# Patient Record
Sex: Female | Born: 1970 | Hispanic: Yes | Marital: Single | Smoking: Never smoker
Health system: Southern US, Community
[De-identification: ages and names within clinical notes are randomized; demographics above are authoritative.]

## PROBLEM LIST (undated history)

## (undated) HISTORY — PX: GALLBLADDER SURGERY: SHX652

---

## 2014-08-20 ENCOUNTER — Ambulatory Visit (INDEPENDENT_AMBULATORY_CARE_PROVIDER_SITE_OTHER): Payer: Self-pay | Admitting: Family Medicine

## 2014-08-20 VITALS — BP 136/82 | HR 78 | Temp 98.1°F | Resp 18 | Ht 58.5 in | Wt 154.8 lb

## 2014-08-20 DIAGNOSIS — H65192 Other acute nonsuppurative otitis media, left ear: Secondary | ICD-10-CM

## 2014-08-20 MED ORDER — HYDROCODONE-ACETAMINOPHEN 5-325 MG PO TABS
1.0000 | ORAL_TABLET | Freq: Four times a day (QID) | ORAL | Status: DC | PRN
Start: 2014-08-20 — End: 2014-12-14

## 2014-08-20 MED ORDER — AMOXICILLIN 875 MG PO TABS: 875.0000 mg | ORAL_TABLET | Freq: Two times a day (BID) | ORAL | Status: DC

## 2014-08-20 NOTE — Progress Notes (Signed)
° °  Subjective:    Patient ID: Christine Brown, female    DOB: 08/17/1970, 44 y.o.   MRN: [id number] This chart was scribed for Robyn Haber, MD by Marti Sleigh, Medical Scribe. This patient was seen in Room 11 and the patient's care was started a 12:13 PM.  Chief Complaint  Patient presents with   Fever    x 1 week   Generalized Body Aches    back pain   Chills   Cough    productive cough, green phlegm   Headache    eye pain, dizziness, weak   Ear Pain    left ear was bleeding yesterday    HPI HPI Comments: Christine Brown is a 44 y.o. female who presents to [facility] complaining of cough productive of green sputum as well as fever and chills with associated generalized body aches and back pain that started 8 days ago. Pt states in the last three days she has developed a severe HA, weakness, photophobia, phonophobia, intermittent dizziness and ear pain with associated bloody discharge. Pt has [place]. Pt states she has been taking pain medication for her HA and ear pain every day for the last week.   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   Review of Systems  Constitutional: Positive for fever and chills.  HENT: Positive for congestion, ear pain, sinus pressure and sore throat.   Respiratory: Positive for cough.   Musculoskeletal: Positive for myalgias.  Neurological: Positive for headaches.       Objective:   Physical Exam  Constitutional: She is oriented to person, place, and time. She appears well-developed and well-nourished.  HENT:  Head: Normocephalic and atraumatic.  Eyes: Pupils are equal, round, and reactive to light.  Neck: Neck supple.  Cardiovascular: Normal rate and regular rhythm.   Pulmonary/Chest: Effort normal and breath sounds normal. No respiratory distress.  Neurological: She is alert and oriented to person, place, and time.  Skin: Skin is warm and dry.  Psychiatric: She has a normal mood and affect. Her behavior is normal.  Nursing note and vitals  reviewed.      Assessment & Plan:   This chart was scribed in my presence and reviewed by me personally.    ICD-9-CM ICD-10-CM   1. Acute nonsuppurative otitis media of left ear 381.00 H65.192 amoxicillin (AMOXIL) 875 MG tablet     HYDROcodone-acetaminophen (NORCO) 5-325 MG per tablet     Signed, Robyn Haber, MD

## 2014-08-20 NOTE — Patient Instructions (Signed)
Otitis media exudativa ( Otitis Media With Effusion) La otitis media exudativa es la presencia de lquido en el odo medio. Es un problema comn en los nios y generalmente, tiene como consecuencia una infeccin en el odo. Puede estar latente durante semanas o ms, luego de la infeccin. A diferencia de una otitis aguda, la otitis media exudativa hace referencia nicamente al lquido que se encuentra detrs del tmpano y no a la infeccin. Los nios que padecen constantemente otitis, sinusitis y problemas de [place] son los ms propensos a tener otitis media exudativa. CAUSAS  La causa ms frecuente de la acumulacin de lquido es la disfuncin de las trompas de [place]. Estos conductos son los que drenan el lquido de los odos hasta la parte posterior de la nariz (nasofaringe). SNTOMAS   El sntoma principal de esta afeccin es la prdida de la audicin. Como consecuencia, es posible que usted o el nio hagan lo siguiente:  Pharmacist, community la televisin a Merchant navy officer.  No responder a las preguntas.  Preguntar "qu?" con frecuencia cuando se les habla.  Equivocarse o confundir una palabra o un sonido por otro.  Probablemente sienta presin en el odo o lo sienta tapado, pero sin dolor. DIAGNSTICO   El mdico diagnosticar esta afeccin luego de examinar sus odos o los del [place].  Es posible que el mdico controle la presin en sus odos o en los del nio con un timpanmetro.  Probablemente se le realice una prueba de audicin si el problema persiste. [place] y los efectos del exudado.  Es posible que los antibiticos, los descongestivos, las gotas nasales y los medicamentos del tipo de la cortisona (en comprimidos o aerosol nasal) no sean de [place].  Los nios con exudado persistente en los odos posiblemente tengan problemas en el desarrollo del lenguaje o problemas de conducta. Es probable que los nios que corren riesgo de sufrir  retrasos en el desarrollo de la audicin, [place] aprendizaje y el habla necesiten ser derivados a un especialista antes que los nios que no corren Copy.  Su mdico o el de su hijo puede sugerirle una derivacin a un otorrinolaringlogo para recibir Lexicographer. Lo siguiente puede ayudar a restaurar la audicin normal:  Drenaje del lquido.  Colocacin de tubos en el odo (tubos de timpanostoma).  Remocin de las adenoides (adenoidectoma). INSTRUCCIONES PARA EL CUIDADO EN EL HOGAR   Evite ser un fumador pasivo.  Los bebs que son amamantados son menos propensos a Social worker.  Evite amamantar al beb mientras est acostada.  Evite los alrgenos ambientales conocidos.  Evite el contacto con personas enfermas. SOLICITE ATENCIN MDICA SI:   La audicin no mejora en 23meses.  La audicin empeora.  Siente dolor de odos.  Tiene una secrecin que sale del odo.  Tiene mareos. ASEGRESE DE QUE:   Comprende estas instrucciones.  Controlar su afeccin.  Recibir ayuda de inmediato si no mejora o si empeora. Document Released: 07/22/2005 Document Revised: 05/12/2013 [facility] Patient Information 2015 [place], [place]. This information is not intended to replace advice given to you by your health care provider. Make sure you discuss any questions you have with your health care provider.

## 2014-08-22 ENCOUNTER — Telehealth: Payer: Self-pay

## 2014-08-22 NOTE — Telephone Encounter (Signed)
Patient called to check the status of her work note. I informed patient it was still in the process and our office would call when ready to be picked up. Patient is requesting to pick it up today because she has to turn it in to her employer tomorrow morning. Patients call back number is [phone]

## 2014-08-22 NOTE — Telephone Encounter (Signed)
Letter written- in pick up drawer Pt notified.

## 2014-08-22 NOTE — Telephone Encounter (Signed)
Patient was seen Saturday by Dr. Carlean Jews and was given an excuse note for Saturday 08/20/14 and Sunday 08/21/14  to be off. He told her if she is still not feeling well with her infection to call our office and we can rewrite another note for her to be excused on Monday 08/22/14. Please call when done with excuse note for her she will come by to pick up today. She would also like it all in one note to say she was to be excused from Saturday 08/20/14- Monday 08/22/14. Thank you  Best: [phone]

## 2014-10-21 ENCOUNTER — Ambulatory Visit: Payer: Self-pay | Admitting: Internal Medicine

## 2014-12-14 ENCOUNTER — Emergency Department (HOSPITAL_COMMUNITY)
Admission: EM | Admit: 2014-12-14 | Discharge: 2014-12-14 | Disposition: A | Discharge status: Home / Self Care | Payer: Worker's Compensation | Attending: Emergency Medicine | Admitting: Emergency Medicine

## 2014-12-14 ENCOUNTER — Encounter (HOSPITAL_COMMUNITY): Payer: Self-pay | Admitting: Emergency Medicine

## 2014-12-14 ENCOUNTER — Emergency Department (HOSPITAL_COMMUNITY): Payer: Worker's Compensation

## 2014-12-14 DIAGNOSIS — S161XXA Strain of muscle, fascia and tendon at neck level, initial encounter: Secondary | ICD-10-CM | POA: Insufficient documentation

## 2014-12-14 DIAGNOSIS — S76219A Strain of adductor muscle, fascia and tendon of unspecified thigh, initial encounter: Secondary | ICD-10-CM

## 2014-12-14 DIAGNOSIS — Z3202 Encounter for pregnancy test, result negative: Secondary | ICD-10-CM | POA: Insufficient documentation

## 2014-12-14 DIAGNOSIS — W19XXXA Unspecified fall, initial encounter: Secondary | ICD-10-CM

## 2014-12-14 DIAGNOSIS — S4992XA Unspecified injury of left shoulder and upper arm, initial encounter: Secondary | ICD-10-CM | POA: Diagnosis not present

## 2014-12-14 DIAGNOSIS — W010XXA Fall on same level from slipping, tripping and stumbling without subsequent striking against object, initial encounter: Secondary | ICD-10-CM | POA: Diagnosis not present

## 2014-12-14 DIAGNOSIS — S79921A Unspecified injury of right thigh, initial encounter: Secondary | ICD-10-CM | POA: Insufficient documentation

## 2014-12-14 DIAGNOSIS — S39012A Strain of muscle, fascia and tendon of lower back, initial encounter: Secondary | ICD-10-CM | POA: Insufficient documentation

## 2014-12-14 DIAGNOSIS — S39011A Strain of muscle, fascia and tendon of abdomen, initial encounter: Secondary | ICD-10-CM | POA: Diagnosis not present

## 2014-12-14 DIAGNOSIS — S199XXA Unspecified injury of neck, initial encounter: Secondary | ICD-10-CM | POA: Diagnosis present

## 2014-12-14 DIAGNOSIS — Z792 Long term (current) use of antibiotics: Secondary | ICD-10-CM | POA: Insufficient documentation

## 2014-12-14 DIAGNOSIS — Y9289 Other specified places as the place of occurrence of the external cause: Secondary | ICD-10-CM | POA: Insufficient documentation

## 2014-12-14 DIAGNOSIS — Y998 Other external cause status: Secondary | ICD-10-CM | POA: Diagnosis not present

## 2014-12-14 DIAGNOSIS — S79922A Unspecified injury of left thigh, initial encounter: Secondary | ICD-10-CM | POA: Diagnosis not present

## 2014-12-14 DIAGNOSIS — Y9389 Activity, other specified: Secondary | ICD-10-CM | POA: Insufficient documentation

## 2014-12-14 LAB — POC URINE PREG, ED: Preg Test, Ur: NEGATIVE

## 2014-12-14 MED ORDER — HYDROCODONE-ACETAMINOPHEN 5-325 MG PO TABS: 1.0000 | ORAL_TABLET | Freq: Four times a day (QID) | ORAL | Status: DC | PRN

## 2014-12-14 MED ORDER — CYCLOBENZAPRINE HCL 10 MG PO TABS: 10.0000 mg | ORAL_TABLET | Freq: Two times a day (BID) | ORAL | Status: DC | PRN

## 2014-12-14 NOTE — ED Notes (Signed)
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  Pt c/o right groin pain, neck and back pain.  Pt can move all her extremities.  Pt denies hitting her head.

## 2014-12-14 NOTE — ED Notes (Signed)
Bed: LM76 Expected date:  Expected time:  Means of arrival:  Comments: Fall back pain

## 2014-12-14 NOTE — Discharge Instructions (Signed)
Distensin lumbosacra (Lumbosacral Strain) La distensin lumbosacra es una distensin de cualquiera de las partes que componen las vrtebras lumbosacras. Las vrtebras lumbosacras son los huesos que conforman el tercio inferior de la columna vertebral. Estas vrtebras estn sostenidas por msculos y un resistente tejido fibroso (ligamentos).  CAUSAS  Un golpe repentino en la espalda puede provocar una distensin lumbosacra. Adems, cualquier tipo de movimiento que cause una elongacin excesiva de los msculos de la zona lumbar puede provocar este tipo de distensin. Esto se ve normalmente en las personas que se esfuerzan demasiado, se caen, levantan objetos pesados, se agachan o estn en cuclillas con regularidad. [place] agotador.  Participar en deportes en los cuales se deba empujar o tirar y que requieren de un giro repentino de la espalda (tenis, golf, bisbol).  Levantar peso.  Curvatura excesiva de la zona lumbar.  Pelvis hacia adelante.  Espalda o msculos abdominales dbiles, o ambos.  Tendones isquiotibiales tensos. SIGNOS Y SNTOMAS  La distensin lumbosacra puede provocar dolor en la zona de la lesin o un dolor que baja (se extiende) hasta la pierna.  DIAGNSTICO Con frecuencia, el mdico puede diagnosticar una distensin [employer] Corporation un examen fsico. En algunos casos, es posible que deba realizarse pruebas, como una [place].  TRATAMIENTO  El tratamiento para la lesin lumbar depende de muchos factores que el mdico Personnel officer. Sin embargo, la [employer] de los tratamientos incluye el uso de antiinflamatorios. INSTRUCCIONES PARA EL CUIDADO EN EL HOGAR   Evite actividades fsicas difciles (tenis, raquetbol, esqu acutico) si no tiene un buen estado fsico para practicarlas. Esto puede Museum/gallery conservator.  Si tiene un problema en la espalda, evite los deportes que requieren de movimientos corporales bruscos. La natacin y las  caminatas son las actividades ms seguras.  Mantenga una buena postura.  Mantenga un peso saludable.  En el caso de episodios agudos, puede colocar hielo en la zona lesionada.  Ponga el hielo en una bolsa plstica.  Coloque una toalla entre la piel y la bolsa de hielo.  Deje el hielo durante 20 minutos, 2 a 3 veces por da.  Cuando la zona lumbar comience a sanar, es posible que le recomienden ejercicios de elongacin y fortalecimiento. SOLICITE ATENCIN MDICA SI:  El dolor de Loss adjuster, chartered.  Tiene un dolor de espalda intenso que no mejora con medicamentos. SOLICITE ATENCIN [place] DE INMEDIATO SI:   Siente entumecimiento, hormigueo, debilidad o problemas con el uso de los brazos o las piernas.  Nota cambios en el control de la vejiga o el intestino.  Siente un aumento del Chief Operating Officer parte del cuerpo, incluido el vientre (abdomen).  Nota que le falta el aire, se siente mareado o se desmaya.  Tiene Higher education careers adviser (nuseas), vomita o comienza a sudar.  Nota un cambio de color en los dedos del pie o las piernas, o los pies se ponen muy fros. ASEGRESE DE QUE:   Comprende estas instrucciones.  Controlar su afeccin.  Recibir ayuda de inmediato si no mejora o si empeora. Document Released: 05/01/2005 Document Revised: 07/27/2013 [facility] Patient Information 2015 [place]. This information is not intended to replace advice given to you by your health care provider. Make sure you discuss any questions you have with your health care provider. Distensin inguinal  (Groin Strain) Una distensin en la ingle (tambin llamado tirn en la ingle) es una lesin en los msculos o los tendones de la parte interna superior del muslo. Estos msculos se llaman aductores  o msculos de la ingle. Son los que permiten mover la pierna cruzando el cuerpo. La distensin muscular se produce cuando un msculo se estira demasiado y algunas fibras musculares se rompen. Una  distensin en la ingle puede variar de leve a grave segn la cantidad de fibras musculares afectadas y si las fibras musculares se han desgarrado parcial o totalmente.  La distensin de la ingle generalmente ocurre durante la prctica de ejercicios o deportes. La lesin ocurre cuando se ejerce una fuerza violenta y sbita en un msculo, y el msculo se extiende demasiado. Tambin ocurre cuando no se hace precalentameinto de los msculos o si no estn debidamente acondicionados. Segn la gravedad de la lesin, el tiempo de recuperacin puede variar desde unas pocas semanas a varias semanas. Las lesiones graves requieren 4 a 6 semanas para la recuperacin. En estos casos, la curacin completa puede demorar 4 a 5 meses.  CAUSAS   Gran estiramiento de los msculos de la ingle o estirarlos muy rpidamente generalmente durante el movimiento de lado a lado, con un cambio brusco de direccin.  Ejercer un esfuerzo repetido Micron Technology de la ingle durante un largo perodo de [place].  Realizar actividades vigorosas sin Electrical engineer los msculos de la ingle. SNTOMAS   Dolor y sensibilidad en el rea inguinal. Comienza como un dolor agudo y persiste como un dolor sordo.  Sensacin de crujido o golpeteo cuando se produce la lesin (por graves distensiones).  Hinchazn o moretones.  Espasmos musculares.  Debilidad en las piernas.  Rigidez en la zona de la ingle, con disminucin de la capacidad de mover los msculos afectados. DIAGNSTICO  El Viacom har un examen fsico para diagnosticar la lesin en la ingle. Le preguntar acerca de sus sntomas y sobre cmo ocurri la lesin. En algunos casos ser necesario descartar una fractura sea o problemas en el cartlago. El mdico le puede indicar una tomografa computada o una resonancia magntica si sospecha un desgarro muscular completo.  TRATAMIENTO  La lesin en la ingle curar por s sola. El mdico puede recetarle medicamentos para  Glass blower/designer y la hinchazn (antiinflamatorios). Es posible que le indiquen que utilice muletas durante los primeros das para Air traffic controller.  INSTRUCCIONES PARA EL CUIDADO EN EL HOGAR   Haga reposo. No use el msculo lesionado si siente dolor.  Aplique hielo sobre la zona lesionada.  Ponga el hielo en una bolsa plstica.  Colquese una toalla entre la piel y la bolsa de hielo.  Deje el hielo en el lugar durante 15 a 20 minutos cada 2  3 horas. Hgalo durante los Dana Corporation despus de la lesin.  Tome slo medicamentos de venta libre o recetados, segn las indicaciones del mdico.  Vende la zona lesionada con una venda elstica segn las indicaciones del mdico.  Mantenga la pierna lesionada levantada elevada.  Camine, elongue y realice ejercicios de amplitud de movimientos para mejorar el flujo de sangre en la zona lesionada. Realice slo estas actividades si puede hacerlas sin dolor. Para evitar distensiones musculares:   Realice precalentamiento antes de la actividad fsica.  Fortifique y [employer] de la ingle. SOLICITE ATENCIN MDICA DE INMEDIATO SI:   Siente un dolor cada vez ms intenso o hinchazn en la zona afecada.   Los sntomas no mejoran o empeoran. ASEGRESE DE QUE:   Comprende estas instrucciones.  Controlar su enfermedad.  Solicitar ayuda de inmediato si no mejora o si empeora. Document Released: 10/29/2007 Document Revised: 07/08/2012  ExitCare Patient Information 2015 [place]. This information is not intended to replace advice given to you by your health care provider. Make sure you discuss any questions you have with your health care provider. Distensin cervical (Cervical Sprain) Una distensin cervical es una lesin en el cuello, en la que los tejidos fuertes y fibrosos (ligamentos) que unen los huesos del cuello, se distienden o se rompen. Una distensin cervical puede ser desde muy leve a muy  grave. En los casos graves pueden hacer que las vrtebras del cuello se vuelvan inestables. Esto puede causar un dao en la mdula espinal y puede dar Environmental consultant a graves problemas del [place]. La cantidad de tiempo que demora la mejora de una distensin cervical depende de la causa y de la extensin de la lesin. [place] veces se cura en 1 a 3 semanas. CAUSAS  Las distensiones graves pueden ser causadas por:   Lesiones por deportes de contacto (como en el ftbol Scientist, forensic, rugby, [place], hockey, automovilismo, gimnasia, buceo, artes Reynolds American y boxeo).  Colisiones en vehculos de motor.  Lesiones de Buyer, retail cervical. Esta es una lesin por movimiento brusco de adelante hacia atrs de la cabeza y el cuello.  Cadas. [place] distensiones cervicales leves pueden ser:   Adoptar posiciones incmodas, como sostener el telfono entre la oreja y [place].  Sentarse en una silla que no ofrece el soporte adecuado.  Trabajar en una mesa de computadora mal diseada.  Las Deere & Company que requieren mirar hacia arriba o hacia abajo durante largos perodos. SNTOMAS   Dolor, sensibilidad, rigidez, o sensacin de ardor en la parte anterior, posterior o lateral del cuello. Este malestar puede aparecer inmediatamente despus de la lesin o puede desarrollarse lentamente y no empezar hasta 24 horas o ms despus de la lesin.  Dolor o sensibilidad que se siente directamente en la parte media posterior del cuello.  Dolor en el hombro o la zona superior de la espalda.  Capacidad limitada para mover el cuello.  Dolor de [place].  Mareos.  Debilidad, entumecimiento u hormigueo en las manos o los brazos.  Espasmos musculares.  Dificultades para tragar o comer.  Sensibilidad e hinchazn en el cuello. [place] veces, el mdico puede diagnosticar este problema mediante la historia clnica y un examen fsico. Su mdico le preguntar acerca de lesiones  previas y problemas conocidos como artritis en el cuello. Podrn tomarle radiografas para determinar si hay otros problemas, como enfermedades en los huesos del cuello. Tambin puede ser Allstate realizar otras pruebas, como tomografas computadas o Health visitor.  TRATAMIENTO  El tratamiento depende de la gravedad de la distensin. Las distensiones leves se pueden tratar con reposo, manteniendo el cuello en su lugar (inmovilizacin) y usando medicamentos para Conservation officer, historic buildings. Las distensiones graves deben ser inmediatamente inmovilizadas. Ser necesario completar el tratamiento para Best boy, los espasmos musculares y otros sntomas, y puede incluir.  Medicamentos como calmantes para el dolor, anestsicos o relajantes musculares.  Fisioterapia. Esto puede incluir ejercicios de elongacin, fortalecimiento y Chiropodist de Advertising copywriter. Los ejercicios y [place] mejor postura pueden ayudar a estabilizar el cuello, fortalecer los msculos y evitar que los sntomas vuelvan a Arts administrator. INSTRUCCIONES PARA EL CUIDADO EN EL HOGAR   Aplique hielo sobre la zona lesionada.  Ponga el hielo en una bolsa plstica.  Colquese una toalla entre la piel y la bolsa de hielo.  Deje el hielo durante 15 - 20 minutos y aplquelo 3 - 4  veces por da.  Si la lesin fue grave, le indicarn el uso de un collarn cervical. El collarn cervical es un collar de dos piezas para impedir que el cuello se mueva [place] se [place].  Nose quite el collarn excepto que se lo indique su mdico.  Si tiene el cabello largo, mantngalo fuera del collarn.  Consulte a su mdico antes de hacerle ajustes. Los [employer] pueden ser requeridos con el tiempo para Garment/textile technologist confort y reducir la presin sobre la barbilla o en la parte posterior de la cabeza.  Si le permiten quitarse el collarn para lavarlo o darse un bao, siga las indicaciones de su mdico acerca de cmo hacerlo con seguridad.  Mantenga el collarn limpio  pasando un pao con agua y [place] y secndolo bien. Si el collarn tiene almohadillas removibles, qutelas cada 1-2 das para lavarlas a mano con agua y [place]. Deje que se sequen al aire. Debe secarlas bien antes de volver a colocarlas en el collarn.  Si le permiten quitarse el collarn para lavarlo y darse un bao, lave y seque la piel del cuello. Controle su piel para detectar irritacin o llagas. Si las tiene, informe a su mdico.  No conduzca vehculos mientras [place] el collarn.  Slo tome medicamentos de venta libre o recetados para Glass blower/designer, el malestar o bajar la fiebre, segn las indicaciones de su mdico.  Cumpla con todas las visitas de control, segn le indique su mdico.  Cumpla con todas las sesiones de fisioterapia, segn le indique su mdico.  Haga los ajustes necesarios en su lugar de [place] para favorecer una buena postura.  Evite las posiciones y actividades que [employer] sntomas.  Haga precalentamiento y elongue antes de comenzar una actividad para Customer service manager. SOLICITE ATENCIN MDICA SI:   El dolor no se alivia con los [employer].  No puede disminuir la dosis de analgsicos segn lo planificado.  Su nivel de actividad no mejora segn lo esperado. SOLICITE ATENCIN MDICA DE INMEDIATO SI:   Presenta cualquier hemorragia.  Siente Higher education careers adviser.  Tiene signos de reaccin alrgica a los medicamentos.  Los sntomas empeoran.  Le aparecen sntomas nuevos e inexplicables.  Siente adormecimiento, hormigueo, debilidad o parlisis en alguna parte del cuerpo. ASEGRESE DE QUE:   Comprende estas instrucciones.  Controlar su afeccin.  Recibir ayuda de inmediato si no mejora o si empeora. Document Released: 10/18/2008 Document Revised: 05/12/2013 [facility] Patient Information 2015 [place]. This information is not intended to replace advice given to you by your health care provider. Make sure you discuss any questions you have with  your health care provider.

## 2014-12-14 NOTE — ED Provider Notes (Signed)
CSN: [id number]     Arrival date & time 12/14/14  1015 History   First MD Initiated Contact with Patient 12/14/14 1022     Chief Complaint  Patient presents with  . Fall  . Groin Pain  . Neck Pain  . Back Pain     (Consider location/radiation/quality/duration/timing/severity/associated sxs/prior Treatment) HPI Comments: Patient with no pertinent past medical history presents to the emergency department with chief complaint of mechanical fall. Patient states that she slipped on a Pool standing water. States that this caused her to "do the splits."  Reports feeling a pop in her groin. She complains of groin pain. Pain is worsened with movement and palpation. There is no bony abnormality or deformity. She also complains of low back pain and left sided neck pain. She denies hitting her head. Denies any LOC. She has not taken anything for her symptoms. She denies any other symptoms at this time.  The history is provided by the patient. No language interpreter was used.    History reviewed. No pertinent past medical history. Past Surgical History  Procedure Laterality Date  . Gallbladder surgery     Family History  Problem Relation Age of Onset  . Heart disease Mother   . Heart disease Father   . Heart disease Brother    History  Substance Use Topics  . Smoking status: Never Smoker   . Smokeless tobacco: Never Used  . Alcohol Use: No   OB History    No data available     Review of Systems  Constitutional: Negative for fever and chills.  Respiratory: Negative for shortness of breath.   Cardiovascular: Negative for chest pain.  Gastrointestinal: Negative for nausea, vomiting, diarrhea and constipation.  Genitourinary: Negative for dysuria.  Musculoskeletal: Positive for myalgias, back pain and arthralgias.      Allergies  Review of patient's allergies indicates no known allergies.  Home Medications   Prior to Admission medications   Medication Sig Start Date End Date  Taking? Authorizing Provider  amoxicillin (AMOXIL) 875 MG tablet Take 1 tablet (875 mg total) by mouth 2 (two) times daily. 08/20/14   Robyn Haber, MD  HYDROcodone-acetaminophen (NORCO) 5-325 MG per tablet Take 1 tablet by mouth every 6 (six) hours as needed for moderate pain. 08/20/14   Robyn Haber, MD   BP 132/69 mmHg  Pulse 88  Temp(Src) 98.9 F (37.2 C) (Oral)  Resp 17  SpO2 98%  LMP 12/05/2014 Physical Exam  Constitutional: She is oriented to person, place, and time. She appears well-developed and well-nourished.  HENT:  Head: Normocephalic and atraumatic.  Eyes: Conjunctivae and EOM are normal. Pupils are equal, round, and reactive to light.  Neck: Normal range of motion. Neck supple.  Cardiovascular: Normal rate and regular rhythm.  Exam reveals no gallop and no friction rub.   No murmur heard. Pulmonary/Chest: Effort normal and breath sounds normal. No respiratory distress. She has no wheezes. She has no rales. She exhibits no tenderness.  Abdominal: Soft. Bowel sounds are normal. She exhibits no distension and no mass. There is no tenderness. There is no rebound and no guarding.  Musculoskeletal: Normal range of motion. She exhibits no edema or tenderness.  Lumbar paraspinal muscles moderately tender to palpation, left sided cervical paraspinal and left upper trapezius muscles tender to palpation, medial thighs mildly tender to palpation bilaterally, no large hematoma, deformity, or abnormality, doubt muscle tear, able to ambulate  Neurological: She is alert and oriented to person, place, and time.  Skin:  Skin is warm and dry.  Psychiatric: She has a normal mood and affect. Her behavior is normal. Judgment and thought content normal.  Nursing note and vitals reviewed.   ED Course  Procedures (including critical care time) Results for orders placed or performed during the hospital encounter of 12/14/14  POC urine preg, ED (not at [facility])  Result Value Ref Range   Preg  Test, Ur NEGATIVE NEGATIVE   Dg Lumbar Spine Complete  12/14/2014   CLINICAL DATA:  Fall.  Back pain  EXAM: LUMBAR SPINE - COMPLETE 4+ VIEW  COMPARISON:  None.  FINDINGS: Negative for fracture or pars defect.  Normal alignment  Moderate disc degeneration at L5-S1 with disc space narrowing and spurring. Remaining disc spaces have normal height.  IMPRESSION: Disc degeneration and spondylosis L5-S1.  Negative for fracture.   Electronically Signed   By: Franchot Gallo M.D.   On: 12/14/2014 12:48      EKG Interpretation None      MDM   Final diagnoses:  Fall, initial encounter  Cervical strain, initial encounter  Lumbar strain, initial encounter  Groin strain, initial encounter    Patient with mechanical fall. Suspect groin strain secondary to bilateral lower extremity extreme abduction. Will check plain films of low back. C-spine cleared by [facility].    Montine Circle, PA-C 12/14/14 [place], MD 12/15/14 [phone]

## 2014-12-16 ENCOUNTER — Encounter (HOSPITAL_COMMUNITY): Payer: Self-pay | Admitting: Emergency Medicine

## 2014-12-16 ENCOUNTER — Emergency Department (HOSPITAL_COMMUNITY)
Admission: EM | Admit: 2014-12-16 | Discharge: 2014-12-16 | Disposition: A | Discharge status: Home / Self Care | Payer: Worker's Compensation | Attending: Emergency Medicine | Admitting: Emergency Medicine

## 2014-12-16 ENCOUNTER — Emergency Department (HOSPITAL_COMMUNITY): Payer: Worker's Compensation

## 2014-12-16 DIAGNOSIS — K5900 Constipation, unspecified: Secondary | ICD-10-CM | POA: Diagnosis present

## 2014-12-16 DIAGNOSIS — W19XXXA Unspecified fall, initial encounter: Secondary | ICD-10-CM

## 2014-12-16 DIAGNOSIS — W19XXXD Unspecified fall, subsequent encounter: Secondary | ICD-10-CM

## 2014-12-16 DIAGNOSIS — S46912D Strain of unspecified muscle, fascia and tendon at shoulder and upper arm level, left arm, subsequent encounter: Secondary | ICD-10-CM | POA: Diagnosis not present

## 2014-12-16 DIAGNOSIS — S93402D Sprain of unspecified ligament of left ankle, subsequent encounter: Secondary | ICD-10-CM | POA: Diagnosis not present

## 2014-12-16 DIAGNOSIS — W1839XD Other fall on same level, subsequent encounter: Secondary | ICD-10-CM | POA: Diagnosis not present

## 2014-12-16 DIAGNOSIS — S99929A Unspecified injury of unspecified foot, initial encounter: Secondary | ICD-10-CM

## 2014-12-16 DIAGNOSIS — Z79899 Other long term (current) drug therapy: Secondary | ICD-10-CM | POA: Insufficient documentation

## 2014-12-16 DIAGNOSIS — M533 Sacrococcygeal disorders, not elsewhere classified: Secondary | ICD-10-CM | POA: Insufficient documentation

## 2014-12-16 DIAGNOSIS — S46812D Strain of other muscles, fascia and tendons at shoulder and upper arm level, left arm, subsequent encounter: Secondary | ICD-10-CM

## 2014-12-16 LAB — COMPREHENSIVE METABOLIC PANEL
ALT: 31 U/L (ref 14–54)
AST: 31 U/L (ref 15–41)
Albumin: 4.1 g/dL (ref 3.5–5.0)
Alkaline Phosphatase: 56 U/L (ref 38–126)
Anion gap: 7 (ref 5–15)
BILIRUBIN TOTAL: 0.8 mg/dL (ref 0.3–1.2)
BUN: 12 mg/dL (ref 6–20)
CO2: 29 mmol/L (ref 22–32)
Calcium: 8.9 mg/dL (ref 8.9–10.3)
Chloride: 103 mmol/L (ref 101–111)
Creatinine, Ser: 0.5 mg/dL (ref 0.44–1.00)
Glucose, Bld: 100 mg/dL — ABNORMAL HIGH (ref 65–99)
POTASSIUM: 3.9 mmol/L (ref 3.5–5.1)
Sodium: 139 mmol/L (ref 135–145)
Total Protein: 7.7 g/dL (ref 6.5–8.1)

## 2014-12-16 LAB — CBC WITH DIFFERENTIAL/PLATELET
BASOS PCT: 0 % (ref 0–1)
Basophils Absolute: 0 10*3/uL (ref 0.0–0.1)
Eosinophils Absolute: 0.1 10*3/uL (ref 0.0–0.7)
Eosinophils Relative: 2 % (ref 0–5)
HEMATOCRIT: 38.8 % (ref 36.0–46.0)
HEMOGLOBIN: 13.3 g/dL (ref 12.0–15.0)
Lymphocytes Relative: 42 % (ref 12–46)
Lymphs Abs: 2.7 10*3/uL (ref 0.7–4.0)
MCH: 32.4 pg (ref 26.0–34.0)
MCHC: 34.3 g/dL (ref 30.0–36.0)
MCV: 94.6 fL (ref 78.0–100.0)
MONO ABS: 0.4 10*3/uL (ref 0.1–1.0)
Monocytes Relative: 6 % (ref 3–12)
NEUTROS ABS: 3.2 10*3/uL (ref 1.7–7.7)
Neutrophils Relative %: 50 % (ref 43–77)
PLATELETS: 231 10*3/uL (ref 150–400)
RBC: 4.1 MIL/uL (ref 3.87–5.11)
RDW: 12.2 % (ref 11.5–15.5)
WBC: 6.4 10*3/uL (ref 4.0–10.5)

## 2014-12-16 MED ORDER — DIAZEPAM 5 MG PO TABS
5.0000 mg | ORAL_TABLET | Freq: Once | ORAL | Status: AC
  Administered 2014-12-16: 5 mg via ORAL
  Filled 2014-12-16: qty 1

## 2014-12-16 MED ORDER — NAPROXEN 500 MG PO TABS: 500.0000 mg | ORAL_TABLET | Freq: Two times a day (BID) | ORAL | Status: DC

## 2014-12-16 MED ORDER — MAGNESIUM CITRATE PO SOLN
1.0000 | Freq: Once | ORAL | Status: AC
  Administered 2014-12-16: 1 via ORAL
  Filled 2014-12-16: qty 296

## 2014-12-16 MED ORDER — DIAZEPAM 5 MG PO TABS: 5.0000 mg | ORAL_TABLET | Freq: Three times a day (TID) | ORAL | Status: DC | PRN

## 2014-12-16 MED ORDER — DEXAMETHASONE SODIUM PHOSPHATE 10 MG/ML IJ SOLN
10.0000 mg | Freq: Once | INTRAMUSCULAR | Status: AC
  Administered 2014-12-16: 10 mg via INTRAMUSCULAR
  Filled 2014-12-16: qty 1

## 2014-12-16 NOTE — Discharge Instructions (Signed)
Take magnesium citrate when you get home. miralax daily until normal bowel movements. Valium for muscle spasms. Naprosyn for pain as prescribed. Stretch. Rest. Follow up with a primary care doctor.     Emergency Department Resource Guide 1) Find a Doctor and Pay Out of Pocket Although you won't have to find out who is covered by your insurance plan, it is a good idea to ask around and get recommendations. You will then need to call the office and see if the doctor you have chosen will accept you as a new patient and what types of options they offer for patients who are self-pay. Some doctors offer discounts or will set up payment plans for their patients who do not have insurance, but you will need to ask so you aren't surprised when you get to your appointment.  2) Contact Your Local Health Department Not all health departments have doctors that can see patients for sick visits, but many do, so it is worth a call to see if yours does. If you don't know where your local health department is, you can check in your phone book. The CDC also has a tool to help you locate your state's health department, and many state websites also have listings of all of their local health departments.  3) Find a [facility] If your illness is not likely to be very severe or complicated, you may want to try a walk in clinic. These are popping up all over the country in pharmacies, drugstores, and shopping centers. They're usually staffed by nurse practitioners or physician assistants that have been trained to treat common illnesses and complaints. They're usually fairly quick and inexpensive. However, if you have serious medical issues or chronic medical problems, these are probably not your best option.  No Primary Care Doctor: - Call Health Connect at  [phone] - they can help you locate a primary care doctor that  accepts your insurance, provides certain services, etc. - Physician Referral Service-  [phone]  Chronic Pain Problems: Organization         Address  Phone   Notes  [facility]  [phone] Patients need to be referred by their primary care doctor.   Medication Assistance: Organization         Address  Phone   Notes  [facility] Medication [facility] [address] [place], [place] [address] [phone] --Must be a resident of [facility] -- Must have NO insurance coverage whatsoever (no Medicaid/ Medicare, etc.) -- The pt. MUST have a primary care doctor that directs their care regularly and follows them in the community   Med[facility]ist  [phone]   [employer]  [phone]    Agencies that provide inexpensive medical care: Organization         Address  Phone   Notes  [place]  [phone]   [facility]    [phone]   [facility] [place], [place] [address] [phone]   [place] [address] [place] [phone]   Planned Parenthood    ([phone]   [facility]    [phone]   [place] and [place] [address], [place] Phone:  [phone], Fax:  [phone] Hours of Operation:  9 am - 6 pm, M-F.  Also accepts Medicaid/Medicare and self-pay.  [facility]  [place], [address], [place] Phone: [phone], Fax: [phone]. Hours of Operation:  8:30 am - 5:30 pm, M-F.  Also accepts Medicaid and self-pay.  [facility] [address], [place] Phone: [phone]   [place], [place], [place] [phone], Ext. [phone] Mondays & Thursdays: 7-9 AM.  First 15 patients are seen on a first come, first serve basis.    [place] Providers:  Organization         Address  Phone   Notes  [facility] [address],  [place] [phone] Also accepts self-pay patients.  [facility] [address], [place]  [phone]   [place], [address], [place] [phone]   [facility] [address] [place] [phone]   Lucianne Lei [address], [place]   [phone] Only accepts [place] Access [place] patients after they have their name applied to their card.   Self-Pay (no insurance) in [facility]:  Organization         Address  Phone   Notes  Sickle Cell Patients, [facility] [place] [phone]   [facility] [place] [phone]   [facility] [place]  [place], [place], [place] [phone]   [facility]/Dr. Osei-Bonsu  [address], [place] or [address], [place] [phone] Phone number for both [place] and [place] locations is the same.  Urgent Medical [facility] [address], [place] [phone]   [facility] [address] [place] or [address] [phone]   [facility] [address] [place] [phone], phone; [phone], fax Sees patients 1st and 3rd Saturday of every month.  Must not qualify for public or private insurance (i.e. Medicaid, Medicare, [place] Health Choice, Veterans' Benefits)  Household income should be no more than 200% of the poverty level The clinic cannot treat you if you are pregnant or think you are pregnant  Sexually transmitted diseases are not treated at the clinic.    Dental Care: Organization         Address  Phone  Notes  [facility] Department of [facility] [place] [phone] Accepts children up to age 50 who are enrolled in  [place] or [place]; pregnant women with a Medicaid card; and children who have applied for Medicaid or [place] Health Choice, but were declined, whose parents can pay a reduced fee at time of service.  [facility] Department of [facility]  [address], [place] [phone] Accepts children up to age 26 who are enrolled in [place] or [place]; pregnant women with a Medicaid card; and children who have applied for Medicaid or [place] Health Choice, but were declined, whose parents can pay a reduced fee at time of service.  [place] Adult Dental Access PROGRAM  [place] [phone] Patients are seen by appointment only. Walk-ins are not accepted. [place] will see patients 70 years of age and older. Monday - Tuesday (8am-5pm) Most Wednesdays (8:30-5pm) $30 per visit, cash only  [place] Adult  Dental Access PROGRAM  [address], [facility] [phone] Patients are seen by appointment only. Walk-ins are not accepted. [place] will see patients 42 years of age and older. One Wednesday Evening (Monthly: Volunteer Based).  $30 per visit, cash only  [place]  [phone] for adults; Children under age 54, call Graduate Pediatric Dentistry at [phone]. Children aged 65-14, please call [phone] to request a pediatric application.  Dental services are provided in all areas of dental care including fillings, crowns and bridges, complete and partial dentures, implants, gum treatment, root canals, and extractions. Preventive care is also provided. Treatment is provided to both adults and children. Patients are selected via a lottery and there is often a waiting list.   [facility] [address] [place]  [phone] [email or web address]   [facility] [address] [place], [place] [phone], Ext. [phone] Second and Fourth Thursday of each month, opens at 6:30  AM; Clinic ends at 9 AM.  Patients are seen on a first-come first-served basis, and a limited number are seen during each clinic.   [facility]  [address] Hillard Danker [place], [place] [phone]   Eligibility Requirements You must have lived in [place], [place], or [place] counties for at least the last three months.   You cannot be eligible for state or federal sponsored [employer], including [employer], [place], or [employer].   You generally cannot be eligible for healthcare insurance through your employer.    How to apply: Eligibility screenings are held every Tuesday and Wednesday afternoon from 1:00 pm until 4:00 pm. You do not need an appointment for the interview!  [facility] [address] [place], [place]   [place]  [place] Department  [place]  [phone]    Behavioral Health Resources in the Community: Intensive Outpatient Programs Organization         Address  Phone  Notes  [place]. [address], [place], [place] [phone]   [facility] Outpatient [address], [place], [place]   ADS: Alcohol & Drug Svcs [address] [place], [place]   [place] [address]  [place], [place] or [phone]   Substance Abuse Resources Organization         Address  Phone  Notes  Alcohol and Drug Services  [phone]   [place]  [phone]   [facility] [place]   Chinita Pester  [phone]   Residential & Outpatient Substance Abuse Program  [phone]   Psychological Services Organization         Address  Phone  Notes  [facility] [place]  [place]  [phone]   [place] [address], [place] [phone] or  [phone]    Mobile Crisis Teams Organization         Address  Phone  Notes  [facility], Mobile Crisis Care Unit  [phone]   [facility] Psychotherapeutic Services  [address] [place], [place]   [place] Levels [address] [place] [phone]    Self-Help/Support Groups Organization         Address  Phone             Notes  Mental  Health Assoc. of [place] - variety of support groups  [place] Call for more information  Narcotics Anonymous (NA), [facility] [address], [employer] [place]  2 meetings at this location   Special educational needs teacher         Address  Phone  Notes  ASAP Residential Treatment [place],    [place]  [phone]   [facility]  [address] [place] [address], [place], [place]   [place], [place] [phone] Admissions: 8am-3pm M-F  Incentives Substance [place] [address],    [place], [place] [phone]   [facility] [address] [place], [place], [place]   [facility] [place] Sexually Violent [facility]ator [facility] Program [address]  [place], [place]   Insight Programs - Intensive Outpatient [address], [place], [place]   [facility] [place].,  [place], [place] [phone] or [phone]   Residential Treatment Services (RTS) [address] [place], [place] Accepts Medicaid  [facility] [place] [address]  [place] [phone] Substance Abuse/Addiction Treatment   [facility] Organization         Address  Phone  Notes  [facility]  [phone]   Domenic Schwab, PhD [address] [place], [place]   [phone] or [phone]   [facility] [place], [place] [phone]   [facility] [address]  [place], [place] [phone] Insurance/Medicaid/sponsorship through [facility] and Families [address] [place] [address]                                    [place], [place] [phone] [place] [address], [place] [phone]    Dr. Adele Schilder  [phone]   [facility] Dept. 1) [address], [place] 2) [place] 3)  [place] [address], [place] [phone]  [phone]   [place] [phone] or [phone] (After Hours)

## 2014-12-16 NOTE — ED Provider Notes (Signed)
CSN: [id number]     Arrival date & time 12/16/14  1727 History   First MD Initiated Contact with Patient 12/16/14 2119     Chief Complaint  Patient presents with  . Constipation  . Fall  . Foot Injury  . Rib Injury  . Abdominal Pain     (Consider location/radiation/quality/duration/timing/severity/associated sxs/prior Treatment) HPI Christine Brown is a 44 y.o. female with no major medical problems, presents to emergency department after a fall. Patient fell 3 days ago the left side while at work. Pt states she slipped on water, fell from standing. Pt was seen and evaluated in the emergency department that time. No acute fractures or major injuries identified at that time. Patient was discharged home with Flexeril and Norco which she has been taking both mild right relief of her pain. She was told that if her symptoms are not improving in 2 days to come back for reevaluation. Patient states her symptoms are actually getting worse. She reports mainly pain to the left side of the neck, left foot, left shin, right buttock. She denies any numbness or weakness in extremities. No visual changes. No memory problems or confusion. No dizziness. No nausea or vomiting. She does state however she feels constipated from the pain medication she has not had a bowel movement 2 days. She currently does not have a PCP.   History reviewed. No pertinent past medical history. Past Surgical History  Procedure Laterality Date  . Gallbladder surgery     Family History  Problem Relation Age of Onset  . Heart disease Mother   . Heart disease Father   . Heart disease Brother    History  Substance Use Topics  . Smoking status: Never Smoker   . Smokeless tobacco: Never Used  . Alcohol Use: No   OB History    No data available     Review of Systems  Constitutional: Negative for fever and chills.  Respiratory: Negative for cough, chest tightness and shortness of breath.   Cardiovascular: Negative for chest  pain, palpitations and leg swelling.  Gastrointestinal: Positive for abdominal pain and constipation. Negative for nausea, vomiting and diarrhea.  Genitourinary: Negative for dysuria, flank pain, vaginal bleeding, vaginal discharge, vaginal pain and pelvic pain.  Musculoskeletal: Positive for myalgias, back pain, arthralgias and neck pain. Negative for neck stiffness.  Skin: Negative for rash.  Neurological: Negative for dizziness, weakness, numbness and headaches.  All other systems reviewed and are negative.     Allergies  Review of patient's allergies indicates no known allergies.  Home Medications   Prior to Admission medications   Medication Sig Start Date End Date Taking? Authorizing Provider  clindamycin (CLEOCIN) 300 MG capsule Take 1 capsule by mouth every 8 (eight) hours. 12/12/14  Yes Historical Provider, MD  cyclobenzaprine (FLEXERIL) 10 MG tablet Take 1 tablet (10 mg total) by mouth 2 (two) times daily as needed for muscle spasms. 12/14/14  Yes Montine Circle, PA-C  HYDROcodone-acetaminophen (NORCO/VICODIN) 5-325 MG per tablet Take 1-2 tablets by mouth every 6 (six) hours as needed. 12/14/14  Yes Montine Circle, PA-C  ibuprofen (ADVIL,MOTRIN) 200 MG tablet Take 400 mg by mouth every 6 (six) hours as needed for headache or moderate pain.   Yes Historical Provider, MD  amoxicillin (AMOXIL) 875 MG tablet Take 1 tablet (875 mg total) by mouth 2 (two) times daily. Patient not taking: Reported on 12/14/2014 08/20/14   Robyn Haber, MD   BP 130/81 mmHg  Pulse 92  Temp(Src) 98.1 F (  36.7 C) (Oral)  Resp 20  SpO2 99%  LMP 12/05/2014 Physical Exam  Constitutional: She appears well-developed and well-nourished. No distress.  HENT:  Head: Normocephalic.  Eyes: Conjunctivae and EOM are normal. Pupils are equal, round, and reactive to light.  Neck: Normal range of motion. Neck supple.  Tender to palpation over left trapezius.  Cardiovascular: Normal rate, regular rhythm and  normal heart sounds.   Pulmonary/Chest: Effort normal and breath sounds normal. No respiratory distress. She has no wheezes. She has no rales.  Abdominal: Soft. Bowel sounds are normal. She exhibits no distension. There is no tenderness. There is no rebound.  Musculoskeletal: She exhibits no edema.  Tender to palpation of the posterior left shoulder, left periscapular muscles. Full range of motion of the shoulder actively and passively. Pain with full flexion, external rotation. Tender to palpation over the left lateral shin. Compartments are soft. Dorsal pedal pulses intact and equal bilaterally. Tender to palpation of the left lateral malleolus and dorsum of the left foot. Full range of motion of the ankle and foot as well as toe joints. Patient is ambulatory. Tender to palpation over the sacrum and coccyx. Pain with right straight leg raise.  Neurological: She is alert.  5/5 and equal lower extremity strength. 2+ and equal patellar reflexes bilaterally. Pt able to dorsiflex bilateral toes and feet with good strength against resistance. Equal sensation bilaterally over thighs and lower legs.   Skin: Skin is warm and dry.  Psychiatric: She has a normal mood and affect. Her behavior is normal.  Nursing note and vitals reviewed.   ED Course  Procedures (including critical care time) Labs Review Labs Reviewed  COMPREHENSIVE METABOLIC PANEL - Abnormal; Notable for the following:    Glucose, Bld 100 (*)    All other components within normal limits  CBC WITH DIFFERENTIAL/PLATELET    Imaging Review Dg Ribs Unilateral W/chest Left  12/16/2014   CLINICAL DATA:  Left rib pain since a fall 3 days ago.  EXAM: LEFT RIBS AND CHEST - 3+ VIEW  COMPARISON:  None.  FINDINGS: No fracture or other bone lesions are seen involving the ribs. There is no evidence of pneumothorax or pleural effusion. Both lungs are clear. Heart size and mediastinal contours are within normal limits.  IMPRESSION: Normal exam.    Electronically Signed   By: Lorriane Shire M.D.   On: 12/16/2014 18:29   Dg Ankle Complete Left  12/16/2014   CLINICAL DATA:  Left ankle pain secondary to a fall 3 days ago.  EXAM: LEFT ANKLE COMPLETE - 3+ VIEW  COMPARISON:  None.  FINDINGS: There is no evidence of fracture, dislocation, or joint effusion. No joint effusion. Small posterior and plantar calcaneal spurs. Slight spurring at the dorsal aspect of the talonavicular joint. Tiny osteophyte at the tip of the medial malleolus.  IMPRESSION: No acute abnormality.  Slight degenerative changes.   Electronically Signed   By: Lorriane Shire M.D.   On: 12/16/2014 18:30   Dg Foot Complete Left  12/16/2014   CLINICAL DATA:  Fall 3 days ago with left foot pain.  EXAM: LEFT FOOT - COMPLETE 3+ VIEW  COMPARISON:  None.  FINDINGS: Examination demonstrates no evidence of fracture or dislocation. Small inferior calcaneal spur is present. Minimal spurring at the talonavicular joint.  IMPRESSION: No acute findings.   Electronically Signed   By: Marin Olp M.D.   On: 12/16/2014 18:30     EKG Interpretation None      MDM  Final diagnoses:  Trapezius strain, left, subsequent encounter  Left ankle sprain, subsequent encounter  Fall, subsequent encounter  Sacral pain  Constipation, unspecified constipation type    Patient is here with persistent pain to left neck, left shoulder, left foot and ankle. She is complaining of lower  sacral pain. He she is neurovascularly intact. Ambulatory. She complaining of some constipation, abdomen is benign. Most likely from Mallory that she is taking. Patient exam is most consistent with muscular strain, spasms. Will add Valium for muscle spasms. Mag citrate for constipation. miralax. Follow-up with primary care doctor.  Filed Vitals:   12/16/14 1742 12/16/14 2159 12/16/14 2321  BP: 130/81 125/64 122/78  Pulse: 92 73   Temp: 98.1 F (36.7 C) 97.9 F (36.6 C)   TempSrc: Oral Oral   Resp: 20 19 16   SpO2: 99% 100%  100%      Jeannett Senior, PA-C 12/17/14 0038  Varney Biles, MD 12/17/14 1530

## 2014-12-16 NOTE — ED Notes (Signed)
Pt recently fell (3 days ago) on left side at work and was seen here for x-rays. No abnormal findings-MD told patient if she did not feel better in 2-3 days to come back. Feels that her pain is worsening and also reports left foot pain. Has not had a bowel movement in 3 days. Reports back pain, neck pain, and lower abdominal pain. Was prescribed Vicodin and Flexeril and took this around 1230 today. No other c/c. Moving all extremities equally. RR even/unlabored.

## 2014-12-20 ENCOUNTER — Encounter (HOSPITAL_COMMUNITY): Payer: Self-pay

## 2014-12-20 ENCOUNTER — Emergency Department (HOSPITAL_COMMUNITY)
Admission: EM | Admit: 2014-12-20 | Discharge: 2014-12-20 | Disposition: A | Discharge status: Home / Self Care | Payer: Worker's Compensation | Attending: Emergency Medicine | Admitting: Emergency Medicine

## 2014-12-20 ENCOUNTER — Emergency Department (HOSPITAL_COMMUNITY): Payer: Worker's Compensation

## 2014-12-20 DIAGNOSIS — W1839XA Other fall on same level, initial encounter: Secondary | ICD-10-CM | POA: Diagnosis not present

## 2014-12-20 DIAGNOSIS — Y9289 Other specified places as the place of occurrence of the external cause: Secondary | ICD-10-CM | POA: Diagnosis not present

## 2014-12-20 DIAGNOSIS — Y9389 Activity, other specified: Secondary | ICD-10-CM | POA: Diagnosis not present

## 2014-12-20 DIAGNOSIS — Y998 Other external cause status: Secondary | ICD-10-CM | POA: Insufficient documentation

## 2014-12-20 DIAGNOSIS — S0990XA Unspecified injury of head, initial encounter: Secondary | ICD-10-CM | POA: Insufficient documentation

## 2014-12-20 DIAGNOSIS — S199XXA Unspecified injury of neck, initial encounter: Secondary | ICD-10-CM | POA: Insufficient documentation

## 2014-12-20 DIAGNOSIS — Z79899 Other long term (current) drug therapy: Secondary | ICD-10-CM | POA: Insufficient documentation

## 2014-12-20 MED ORDER — FENTANYL CITRATE (PF) 100 MCG/2ML IJ SOLN
50.0000 ug | Freq: Once | INTRAMUSCULAR | Status: AC
  Administered 2014-12-20: 50 ug via INTRAMUSCULAR
  Filled 2014-12-20: qty 2

## 2014-12-20 MED ORDER — KETOROLAC TROMETHAMINE 30 MG/ML IJ SOLN
30.0000 mg | Freq: Once | INTRAMUSCULAR | Status: AC
  Administered 2014-12-20: 30 mg via INTRAMUSCULAR
  Filled 2014-12-20: qty 1

## 2014-12-20 MED ORDER — HYDROCODONE-ACETAMINOPHEN 5-325 MG PO TABS: 1.0000 | ORAL_TABLET | Freq: Four times a day (QID) | ORAL | Status: DC | PRN

## 2014-12-20 MED ORDER — IBUPROFEN 200 MG PO TABS: 400.0000 mg | ORAL_TABLET | Freq: Three times a day (TID) | ORAL | Status: AC

## 2014-12-20 NOTE — ED Notes (Signed)
Pt with injury to back at work last Wednesday.  Pt has had continual headache and back pain since injury.  Return on 5/13 for same.

## 2014-12-20 NOTE — ED Provider Notes (Signed)
CSN: [id number]     Arrival date & time 12/20/14  1227 History   First MD Initiated Contact with Patient 12/20/14 1338     Chief Complaint  Patient presents with  . Headache  . Back Pain     (Consider location/radiation/quality/duration/timing/severity/associated sxs/prior Treatment) HPI Patient presents to the third time following a fall that occurred one week ago. She completes ongoing discomfort generally, as well as left-sided pain focally. The pain is worse in the left posterior head, left lateral side. Pain is sore, severe, not improved with OTC medication. There is occasional exacerbation, with activity. No new difficulty breathing, abdominal pain. No new syncope, falling. Patient was well prior to the fall, which was described in previous chart, as slipping on a wet surface, falling onto her buttocks. Initial evaluation at that time, and a subsequent, was reassuring.  History reviewed. No pertinent past medical history. Past Surgical History  Procedure Laterality Date  . Gallbladder surgery     Family History  Problem Relation Age of Onset  . Heart disease Mother   . Heart disease Father   . Heart disease Brother    History  Substance Use Topics  . Smoking status: Never Smoker   . Smokeless tobacco: Never Used  . Alcohol Use: No   OB History    No data available     Review of Systems  All other systems reviewed and are negative.     Allergies  Review of patient's allergies indicates no known allergies.  Home Medications   Prior to Admission medications   Medication Sig Start Date End Date Taking? Authorizing Provider  diazepam (VALIUM) 5 MG tablet Take 1 tablet (5 mg total) by mouth every 8 (eight) hours as needed for muscle spasms. 12/16/14  Yes Tatyana Kirichenko, PA-C  ibuprofen (ADVIL,MOTRIN) 200 MG tablet Take 400 mg by mouth every 6 (six) hours as needed for headache or moderate pain.   Yes Historical Provider, MD  naproxen (NAPROSYN) 500 MG  tablet Take 1 tablet (500 mg total) by mouth 2 (two) times daily. 12/16/14  Yes Tatyana Kirichenko, PA-C  amoxicillin (AMOXIL) 875 MG tablet Take 1 tablet (875 mg total) by mouth 2 (two) times daily. Patient not taking: Reported on 12/14/2014 08/20/14   Robyn Haber, MD  clindamycin (CLEOCIN) 300 MG capsule Take 1 capsule by mouth every 8 (eight) hours. 12/12/14   Historical Provider, MD  cyclobenzaprine (FLEXERIL) 10 MG tablet Take 1 tablet (10 mg total) by mouth 2 (two) times daily as needed for muscle spasms. Patient not taking: Reported on 12/20/2014 12/14/14   Montine Circle, PA-C  HYDROcodone-acetaminophen (NORCO/VICODIN) 5-325 MG per tablet Take 1-2 tablets by mouth every 6 (six) hours as needed. Patient not taking: Reported on 12/20/2014 12/14/14   Montine Circle, PA-C   BP 139/93 mmHg  Pulse 96  Temp(Src) 98.3 F (36.8 C) (Oral)  Resp 18  SpO2 100%  LMP 12/05/2014 Physical Exam  Constitutional: She is oriented to person, place, and time. She appears well-developed and well-nourished. No distress.  HENT:  Head: Normocephalic and atraumatic.    Eyes: Conjunctivae and EOM are normal. Pupils are equal, round, and reactive to light.  Neck: Normal range of motion. Neck supple. Muscular tenderness present. No spinous process tenderness present. No rigidity. No edema, no erythema and normal range of motion present.    Cardiovascular: Normal rate and regular rhythm.   Pulmonary/Chest: Effort normal and breath sounds normal. No stridor. No respiratory distress.  Abdominal: She exhibits no distension.  Musculoskeletal: She exhibits no edema.  Neurological: She is alert and oriented to person, place, and time. She displays no atrophy and no tremor. No cranial nerve deficit or sensory deficit. She exhibits normal muscle tone. She displays no seizure activity. Coordination normal.  Skin: Skin is warm and dry.  Psychiatric: She has a normal mood and affect.  Nursing note and vitals  reviewed.   ED Course  Procedures (including critical care time) Labs Review Labs Reviewed - No data to display  Imaging Review Ct Head Wo Contrast  12/20/2014   CLINICAL DATA:  Continued headache and back pain since injury last Wednesday.  EXAM: CT HEAD WITHOUT CONTRAST  TECHNIQUE: Contiguous axial images were obtained from the base of the skull through the vertex without intravenous contrast.  COMPARISON:  None.  FINDINGS: There is no evidence of mass effect, midline shift or extra-axial fluid collections. There is no evidence of a space-occupying lesion or intracranial hemorrhage. There is no evidence of a cortical-based area of acute infarction.  The ventricles and sulci are appropriate for the patient's age. The basal cisterns are patent.  Visualized portions of the orbits are unremarkable. The visualized portions of the paranasal sinuses and mastoid air cells are unremarkable.  The osseous structures are unremarkable.  IMPRESSION: Normal CT of the brain without intravenous contrast.   Electronically Signed   By: Kathreen Devoid   On: 12/20/2014 15:15    After the initial evaluation I reviewed the patient's chart, including 2 recent emergency department visits following a fall.   I reviewed the results (including imaging as performed), agree with the interpretation  On repeat exam the patient appears better.  We reviewed all findings. 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  MDM  She presents with ongoing pain following a fall that occurred one week ago. Specifically, today the patient has concerns over severe pain in the left lateral posterior head. No evidence for neurologic dysfunction, and with normal CT, there is low suspicion for intracranial pathology, neurovascular compromise, or other acute new injury. Patient received additional analgesics, and was discharged in stable condition to follow-up with corporate health.  Carmin Muskrat,  MD 12/20/14 [phone]

## 2014-12-20 NOTE — Discharge Instructions (Signed)
Tomorrow, you need to go to the [facility] at your employer's office.  It is very important that you follow-up there to ensure that you have appropriate ongoing management of your pain following the fall.  Return here for concerning changes in your condition.

## 2014-12-22 ENCOUNTER — Ambulatory Visit: Payer: Self-pay | Attending: Family Medicine | Admitting: Family Medicine

## 2014-12-22 VITALS — BP 125/83 | HR 101 | Temp 98.3°F | Wt 151.8 lb

## 2014-12-22 DIAGNOSIS — M79609 Pain in unspecified limb: Secondary | ICD-10-CM

## 2014-12-22 NOTE — Progress Notes (Signed)
Patient ID: Christine Brown, female   DOB: 08/15/1970, 44 y.o.   MRN: [id number]   Christine Brown, is a 44 y.o. female  JEH:[id number]  [id number]:[id number]  DOB - [birth year]  CC:  Chief Complaint  Patient presents with  . New pateint    Slip and fell on 12/20/2014  . Left hip pain       HPI: Christine Brown is a 44 y.o. female here today to establish medical care and to follow-up on 3 ED visits related to a fall. She slipped on water at work and has wide spread including neck left shoulder left lower back, left leg, knee and ankle. She has had a head CT and x-rays of other affected areas and no acute bony or head injury found. She was instructed in the ED to follow-up with [employer] through her work with issues related to the fall. She reports still have significant pain to keep her out of work and ask for a work note. She has numberous positives on her ROS. She denies having any of these prior to the fall.   No Known Allergies No past medical history on file. Current Outpatient Prescriptions on File Prior to Visit  Medication Sig Dispense Refill  . diazepam (VALIUM) 5 MG tablet Take 1 tablet (5 mg total) by mouth every 8 (eight) hours as needed for muscle spasms. 15 tablet 0  . amoxicillin (AMOXIL) 875 MG tablet Take 1 tablet (875 mg total) by mouth 2 (two) times daily. (Patient not taking: Reported on 12/14/2014) 20 tablet 0  . cyclobenzaprine (FLEXERIL) 10 MG tablet Take 1 tablet (10 mg total) by mouth 2 (two) times daily as needed for muscle spasms. (Patient not taking: Reported on 12/20/2014) 20 tablet 0  . HYDROcodone-acetaminophen (NORCO/VICODIN) 5-325 MG per tablet Take 1-2 tablets by mouth every 6 (six) hours as needed. (Patient not taking: Reported on 12/22/2014) 13 tablet 0  . ibuprofen (ADVIL,MOTRIN) 200 MG tablet Take 2 tablets (400 mg total) by mouth 3 (three) times daily. (Patient not taking: Reported on 12/22/2014) 12 tablet 0   No current facility-administered medications on  file prior to visit.   Family History  Problem Relation Age of Onset  . Heart disease Mother   . Heart disease Father   . Heart disease Brother    History   Social History  . Marital Status: Single    Spouse Name: N/A  . Number of Children: N/A  . Years of Education: N/A   Occupational History  . Not on file.   Social History Main Topics  . Smoking status: Never Smoker   . Smokeless tobacco: Never Used  . Alcohol Use: No  . Drug Use: No  . Sexual Activity: Not on file   Other Topics Concern  . Not on file   Social History Narrative    Review of Systems: Constitutional: Negative for fever,  weight loss,  Fatigue.Positive for chills and wt loss. HENT: Negative for ear discharge.nose bleeds. Positive for ear pain Eyes: Negative for pain, discharge, redness, itching and visual disturbance. Neck: Positive  for pain, stiffness, occassional ST. Negative for swollen nodes. Respiratory: Negative for cough, shortness of breath. Cardiovascular: Negative palpitations and leg swelling.Positive for musculoskeletal chest pain Gastrointestinal: Negative for abdominal distention, abdominal pain, nausea, vomiting,Positive for diarrhea, constipations Genitourinary: Negative for dysuria, urgency, frequency, hematuria, flank pain,  Musculoskeletal: Positive  for back pain, joint pain, joint  swelling, arthralgia and gait problem.Negative for weakness. Neurological: Negative for dizziness, tremors, seizures, syncope,  light-headedness, numbness and headaches.  Hematological: Negative for easy bruising or bleeding Psychiatric/Behavioral: Negative for depression, anxiety, decreased concentration, confusion   Objective:   Filed Vitals:   12/22/14 0929  BP: 125/83  Pulse: 101  Temp: 98.3 F (36.8 C)    Physical Exam: Constitutional: Patient appears well-developed and well-nourished. No distress. HENT: Normocephalic, atraumatic, External right and left ear normal. Oropharynx is clear  and moist.  Eyes: Conjunctivae and EOM are normal. PERRLA, no scleral icterus. Neck: Decreased ROM due to pain and stiffness  No lymphadenopathy, No thyromegaly. CVS: RRR, S1/S2 +, no murmurs, no gallops, no rubs Pulmonary: Effort and breath sounds normal, no stridor, rhonchi, wheezes, rales.  Abdominal: Soft. Normoactive BS,, no distension, tenderness, rebound or guarding.  Musculoskeletal: She claims pain on almost every spot touch with fairly light pressure. More on the left that right. She walks with a limp and has some difficulty getting off and on stretcher. Neuro: Alert.Normal muscle tone coordination. Non-focal Skin: Skin is warm and dry. No rash noted. Not diaphoretic. No erythema. No pallor. Psychiatric: Normal mood and affect. Behavior, judgment, thought content normal.  Lab Results  Component Value Date   WBC 6.4 12/16/2014   HGB 13.3 12/16/2014   HCT 38.8 12/16/2014   MCV 94.6 12/16/2014   PLT 231 12/16/2014   Lab Results  Component Value Date   CREATININE 0.50 12/16/2014   BUN 12 12/16/2014   NA 139 12/16/2014   K 3.9 12/16/2014   CL 103 12/16/2014   CO2 29 12/16/2014    No results found for: HGBA1C Lipid Panel  No results found for: CHOL, TRIG, HDL, CHOLHDL, VLDL, LDLCALC     Assessment :   1. Musculoskeletal pain (widespread) following a fall on or about 5/11 2. Need to establish health care.  Plan: 1.  Continue treatment per ED and urgent care, follow-up with [employer] for issues related to fall and disability. I have provided a note for work until next Wednesday. 2.  Make an appointment here for about a month to follow-up with assigned PCP      The patient was given clear instructions to go to ER or return to medical center if symptoms don't improve, worsen or new problems develop. The patient verbalized understanding. The patient was told to call to get lab results if they haven't heard anything in the next week.        Micheline Chapman, MSN, FNP-BC [place], [place]   12/22/2014, 12:58 PM

## 2014-12-22 NOTE — Patient Instructions (Signed)
Follow-instructions from Urgent Care and ED. Get an appointment with Company doctor to follow-up on symptoms related to the fall Make an appointment with assigned provider here for one month for routine health care.

## 2014-12-28 ENCOUNTER — Telehealth: Payer: Self-pay | Admitting: Family Medicine

## 2014-12-28 NOTE — Telephone Encounter (Signed)
Per Vaughan Basta patient needs to follow up with PCP provider. She will need to follow with pcp.

## 2014-12-28 NOTE — Telephone Encounter (Signed)
Patient came into office requesting medication refill for diazepam (VALIUM) 5 MG tablet. Please f/u with patient

## 2015-01-19 ENCOUNTER — Ambulatory Visit: Payer: Worker's Compensation | Attending: Internal Medicine

## 2015-01-19 ENCOUNTER — Encounter: Payer: Self-pay | Admitting: Family Medicine

## 2015-01-19 ENCOUNTER — Ambulatory Visit (HOSPITAL_BASED_OUTPATIENT_CLINIC_OR_DEPARTMENT_OTHER): Payer: Worker's Compensation | Admitting: Family Medicine

## 2015-01-19 VITALS — BP 117/72 | HR 70 | Temp 98.1°F | Resp 16 | Ht 58.5 in | Wt 152.0 lb

## 2015-01-19 DIAGNOSIS — M79605 Pain in left leg: Principal | ICD-10-CM

## 2015-01-19 DIAGNOSIS — E559 Vitamin D deficiency, unspecified: Secondary | ICD-10-CM

## 2015-01-19 DIAGNOSIS — M791 Myalgia: Secondary | ICD-10-CM | POA: Insufficient documentation

## 2015-01-19 DIAGNOSIS — Z114 Encounter for screening for human immunodeficiency virus [HIV]: Secondary | ICD-10-CM | POA: Insufficient documentation

## 2015-01-19 DIAGNOSIS — M545 Low back pain: Secondary | ICD-10-CM | POA: Insufficient documentation

## 2015-01-19 DIAGNOSIS — E669 Obesity, unspecified: Secondary | ICD-10-CM | POA: Diagnosis not present

## 2015-01-19 DIAGNOSIS — Z6831 Body mass index (BMI) 31.0-31.9, adult: Secondary | ICD-10-CM | POA: Insufficient documentation

## 2015-01-19 DIAGNOSIS — M25572 Pain in left ankle and joints of left foot: Secondary | ICD-10-CM | POA: Diagnosis not present

## 2015-01-19 DIAGNOSIS — M25552 Pain in left hip: Secondary | ICD-10-CM | POA: Diagnosis not present

## 2015-01-19 MED ORDER — MELOXICAM 15 MG PO TABS: 15.0000 mg | ORAL_TABLET | Freq: Every day | ORAL | Status: AC

## 2015-01-19 MED ORDER — GABAPENTIN 300 MG PO CAPS: 300.0000 mg | ORAL_CAPSULE | Freq: Every day | ORAL | Status: AC

## 2015-01-19 MED ORDER — KETOROLAC TROMETHAMINE 60 MG/2ML IM SOLN
60.0000 mg | Freq: Once | INTRAMUSCULAR | Status: AC
  Administered 2015-01-19: 60 mg via INTRAMUSCULAR

## 2015-01-19 NOTE — Assessment & Plan Note (Signed)
.   L sided pains: Mobic once daily for inflammation  Gabapentin 300 mg at night for first week then 600 mg at night Referral to physical therapy Home exercises  Checking vitamin D level  If you are not improved in 6 weeks, will obtain MRI of low back

## 2015-01-19 NOTE — Patient Instructions (Addendum)
Ms. Christine Brown,  Thank you for coming in today. It was a pleasure meeting you. I look forward to being your primary doctor.   1. L sided pains: Mobic once daily for inflammation  Gabapentin 300 mg at night for first week then 600 mg at night Referral to physical therapy Home exercises  Checking vitamin D level  If you are not improved in 6 weeks, will obtain MRI of low back   2. Healthcare maintenance: Due for  Screening HIV- checking today Pap smear Tdap  F/u in 3-4 weeks for pap smear  F/u in 6 weeks for L back pain   Dr. Adrian Blackwater   Ejercicios para la espalda (Back Exercises) Estos ejercicios ayudan a tratar y a prevenir lesiones en la espalda. El objetivo es aumentar la fuerza en los msculos del vientre (abdomen) y de la espalda. Estos ejercicios tambin lo ayudarn a mejorar la flexibilidad. Comience a realizar estos ejercicios cuando el mdico se lo indique. CUIDADOS EN EL HOGAR Los ejercicios para la espalda incluyen: Inclinacin de la pelvis.  Recustese sobre la espalda con las rodillas flexionadas. Incline la pelvis hasta que la parte inferior de la espalda se apoye en el piso. Mantenga esta posicin durante 5 a 10 segundos. Repita este ejercicio 5 a 10 veces. Rodilla al pecho.  Empuje con la rodilla contra el pecho y [place] posicin durante 20 a 30 segundos. Repita con la otra pierna. Esto puede realizarlo con la otra pierna extendida o flexionada, del modo en que se sienta ms cmodo. Luego presione ambas rodillas contra el pecho. Abdominales.  [place] a 90 grados. Comience doblando la pelvis y haga un abdominal parcial y en forma lenta. Slo eleve la parte superior a 30  45 grados del suelo. Emplee al [employer] 2 y 3 segundos para cada abdominal. No haga los abdominales con las rodillas extendidas. Si hacer abdominales parciales le resulta difcil, simplemente haga el ejercicio pero slo endureciendo los msculos del vientre(abdomen) y manteniendo  segn la indicacin. Elevar la cadera.  Recustese sobre la espalda con las rodillas flexionadas a 90 grados. Presione con los pies y los hombros a medida que eleva las caderas a 5 cm del suelo. Mantenga durante 10 segundos y repita 5 a 10 veces. Arquear la espalda.  Acustese [employer]. Levntese apoyando los codos doblados. Presione lentamente con las manos, formando un arco con la zona inferior de la espalda. Repita entre 3 y 5 veces. Elevar los hombros.  Acustese boca abajo con los brazos a los lados del cuerpo. Thorp y Photographer torso contra el suelo mientras eleva lentamente la cabeza y los hombros del suelo. No exagere al [employer] ejercicios. Tenga cuidado al principio. Los ejercicios pueden causar algunas molestias leves en la espalda. Si el dolor dura ms de 15 minutos, detenga los ejercicios hasta que consulte al mdico. Los problemas en la espalda mejoran de [place] lenta con esta terapia.  Document Released: 11/06/2010 Document Revised: 10/14/2011 [facility] Patient Information 2015 [place]. This information is not intended to replace advice given to you by your health care provider. Make sure you discuss any questions you have with your health care provider.

## 2015-01-19 NOTE — Assessment & Plan Note (Addendum)
Screening HIV ordered  Screening HIV neg

## 2015-01-19 NOTE — Progress Notes (Signed)
   Subjective:    Patient ID: Christine Brown, female    DOB: Oct 24, 1970, 44 y.o.   MRN: [id number] CC: f/u [facility] pain  Spanish interpreter present HPI  44 yo F with no significant medical history  1. Back pain: since fall on 12/14/14/ pain in L sided mostly. Upper back near shoulder blade, low back, L lateral hip, L leg and ankle. Feels achy and tired when walking. 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 Worried about bills.   Soc Hx: non smoker  Review of Systems  Constitutional: Negative for fever and chills.  Musculoskeletal: Positive for myalgias, back pain and arthralgias. Negative for joint swelling, gait problem, neck pain and neck stiffness.       Objective:   Physical Exam BP 117/72 mmHg  Pulse 70  Temp(Src) 98.1 F (36.7 C) (Oral)  Resp 16  Ht 4' 10.5" (1.486 m)  Wt 152 lb (68.947 kg)  BMI 31.22 kg/m2  SpO2 100%  LMP 01/08/2015 General appearance: alert, cooperative and mildly obese Back Exam: Back: Normal Curvature, no deformities or CVA tenderness  Paraspinal Tenderness: L lumbar and L upper back in infraspinatus area   LE Strength 5/5  LE Sensation: in tact  LE Reflexes 2+ and symmetric  Straight leg raise: negative   Extremities: extremities normal, atraumatic, no cyanosis or edema       Assessment & Plan:

## 2015-01-19 NOTE — Progress Notes (Signed)
Establish Care  With pcp F/U back pain due to injury, back pain radiating to lt leg

## 2015-01-20 LAB — HIV ANTIBODY (ROUTINE TESTING W REFLEX): HIV: NONREACTIVE

## 2015-01-20 LAB — VITAMIN D 25 HYDROXY (VIT D DEFICIENCY, FRACTURES): Vit D, 25-Hydroxy: 15 ng/mL — ABNORMAL LOW (ref 30–100)

## 2015-01-25 ENCOUNTER — Telehealth: Payer: Self-pay | Admitting: *Deleted

## 2015-01-25 DIAGNOSIS — E559 Vitamin D deficiency, unspecified: Secondary | ICD-10-CM | POA: Insufficient documentation

## 2015-01-25 MED ORDER — VITAMIN D3 50 MCG (2000 UT) PO TABS: 2000.0000 [IU] | ORAL_TABLET | Freq: Every day | ORAL | Status: AC

## 2015-01-25 MED ORDER — VITAMIN D (ERGOCALCIFEROL) 1.25 MG (50000 UNIT) PO CAPS: 50000.0000 [IU] | ORAL_CAPSULE | ORAL | Status: AC

## 2015-01-25 NOTE — Addendum Note (Signed)
Addended by: Boykin Nearing on: 01/25/2015 05:01 PM   Modules accepted: Orders

## 2015-01-25 NOTE — Telephone Encounter (Signed)
LVM to return call.

## 2015-01-25 NOTE — Telephone Encounter (Signed)
-----   Message from Boykin Nearing, MD sent at 01/25/2015  4:59 PM EDT ----- Screening HIV negative  VitD def will treat

## 2015-01-25 NOTE — Assessment & Plan Note (Signed)
A: vit D def P: 50 K U D3 x 8 weeks Followed by 2 K U D3 daily

## 2015-02-16 ENCOUNTER — Encounter: Payer: Self-pay | Admitting: Family Medicine

## 2015-02-16 ENCOUNTER — Ambulatory Visit: Payer: Worker's Compensation | Attending: Family Medicine | Admitting: Family Medicine

## 2015-02-16 VITALS — BP 109/71 | HR 80 | Temp 98.4°F | Resp 16 | Ht 58.5 in | Wt 150.0 lb

## 2015-02-16 DIAGNOSIS — B373 Candidiasis of vulva and vagina: Secondary | ICD-10-CM | POA: Insufficient documentation

## 2015-02-16 DIAGNOSIS — B3731 Acute candidiasis of vulva and vagina: Secondary | ICD-10-CM

## 2015-02-16 DIAGNOSIS — N9489 Other specified conditions associated with female genital organs and menstrual cycle: Secondary | ICD-10-CM | POA: Diagnosis not present

## 2015-02-16 DIAGNOSIS — Z0000 Encounter for general adult medical examination without abnormal findings: Secondary | ICD-10-CM | POA: Diagnosis not present

## 2015-02-16 DIAGNOSIS — Z124 Encounter for screening for malignant neoplasm of cervix: Secondary | ICD-10-CM | POA: Insufficient documentation

## 2015-02-16 DIAGNOSIS — Z23 Encounter for immunization: Secondary | ICD-10-CM

## 2015-02-16 MED ORDER — ASPIRIN EC 81 MG PO TBEC: 81.0000 mg | DELAYED_RELEASE_TABLET | Freq: Every day | ORAL | Status: AC

## 2015-02-16 NOTE — Telephone Encounter (Signed)
Results given to pt today at [place]

## 2015-02-16 NOTE — Progress Notes (Signed)
   Subjective:    Patient ID: Christine Brown, female    DOB: 09-08-1970, 44 y.o.   MRN: [id number] CC: pap smear and breast exam  Spanish interpreter present  HPI 44 yo F presents for f/u visit:  1. Breast exam and pap: has not hx of abnormal pap smears. Had a mammogram in [place] in her 60s. No breast pain or mass. No family history of breast cancer.    2. Labial swelling:  No vaginal discharge or abnormal bleeding. Does have swelling in L labia minora since end of LMP that occurred 3-4 months ago. No fever or chills. No genital lesions   3. Pap smear: no history of abnormal pap smears. No irregular bleeding or pelvic pain. No abdominal pain.   Soc Hx: non smoker  Review of Systems  Constitutional: Negative for fever and chills.  Respiratory: Negative for shortness of breath.   Cardiovascular: Negative for chest pain.  Gastrointestinal: Negative for abdominal pain.  Genitourinary: Negative for dysuria, decreased urine volume, vaginal bleeding, vaginal discharge, genital sores, vaginal pain, menstrual problem and dyspareunia.       "bump" in L labia   Skin: Negative for rash.  Psychiatric/Behavioral: Negative for suicidal ideas and dysphoric mood.      Objective:   Physical Exam  BP 109/71 mmHg  Pulse 80  Temp(Src) 98.4 F (36.9 C) (Oral)  Resp 16  Ht 4' 10.5" (1.486 m)  Wt 150 lb (68.04 kg)  BMI 30.81 kg/m2  SpO2 99%  LMP 01/28/2015 General appearance: alert, cooperative and no distress Breasts: normal appearance, no masses or tenderness, Inspection negative, No nipple retraction or dimpling, No nipple discharge or bleeding, No axillary or supraclavicular adenopathy, Normal to palpation without dominant masses Pelvic: cervix normal in appearance, no adnexal masses or tenderness, no cervical motion tenderness, rectovaginal septum normal, uterus normal size, shape, and consistency, vagina normal without discharge and external genitalia with swelling in L labia minora, no  erythema, area is soft, tender to palpation, non-fluctuant      Assessment & Plan:

## 2015-02-16 NOTE — Assessment & Plan Note (Addendum)
A: recurrent L sided labial swelling ddx varicose vein or blocked gland  P: Hot compresses or sitz baths asprin

## 2015-02-16 NOTE — Patient Instructions (Signed)
Mrs. Christine Brown,  Thank you for coming in today  1. Pap smear done today  2. Normal breast exam  3. Left labial swelling: Sit in warm bath or use warm compress daily  Take aspirin to reduce pain and swelling  Return if swelling and pain worsens  F/u in 2 months with RN for flu shot F/u in 6 months with me   Dr. Adrian Blackwater

## 2015-02-16 NOTE — Progress Notes (Signed)
Annual Pap and Physicol

## 2015-02-17 LAB — CERVICOVAGINAL ANCILLARY ONLY
Chlamydia: NEGATIVE
Neisseria Gonorrhea: NEGATIVE

## 2015-02-20 DIAGNOSIS — B3731 Acute candidiasis of vulva and vagina: Secondary | ICD-10-CM | POA: Insufficient documentation

## 2015-02-20 DIAGNOSIS — B373 Candidiasis of vulva and vagina: Secondary | ICD-10-CM | POA: Insufficient documentation

## 2015-02-20 LAB — CYTOLOGY - PAP

## 2015-02-20 LAB — CERVICOVAGINAL ANCILLARY ONLY: WET PREP (BD AFFIRM): POSITIVE — AB

## 2015-02-20 MED ORDER — FLUCONAZOLE 150 MG PO TABS: 150.0000 mg | ORAL_TABLET | ORAL | Status: DC

## 2015-02-20 NOTE — Addendum Note (Signed)
Addended by: Boykin Nearing on: 02/20/2015 09:19 AM   Modules accepted: Orders

## 2015-02-22 ENCOUNTER — Telehealth: Payer: Self-pay | Admitting: *Deleted

## 2015-02-22 NOTE — Telephone Encounter (Signed)
-----   Message from Boykin Nearing, MD sent at 02/17/2015  3:48 PM EDT ----- Normal Gc.chlam

## 2015-02-22 NOTE — Telephone Encounter (Signed)
LVM to return call.

## 2015-02-22 NOTE — Telephone Encounter (Signed)
-----   Message from Boykin Nearing, MD sent at 02/20/2015  4:00 PM EDT ----- Normal pap, repeat in 3 years

## 2015-02-22 NOTE — Telephone Encounter (Signed)
-----   Message from Boykin Nearing, MD sent at 02/20/2015  9:17 AM EDT ----- Yeast on wet prep, sent in diflucan

## 2015-03-02 ENCOUNTER — Ambulatory Visit: Payer: Worker's Compensation | Attending: Family Medicine | Admitting: Family Medicine

## 2015-03-02 ENCOUNTER — Encounter: Payer: Self-pay | Admitting: Family Medicine

## 2015-03-02 VITALS — BP 107/73 | HR 81 | Temp 97.9°F | Resp 16 | Wt 149.0 lb

## 2015-03-02 DIAGNOSIS — M7989 Other specified soft tissue disorders: Secondary | ICD-10-CM | POA: Insufficient documentation

## 2015-03-02 DIAGNOSIS — M545 Low back pain: Secondary | ICD-10-CM | POA: Diagnosis not present

## 2015-03-02 DIAGNOSIS — G8929 Other chronic pain: Secondary | ICD-10-CM | POA: Insufficient documentation

## 2015-03-02 DIAGNOSIS — M25562 Pain in left knee: Secondary | ICD-10-CM | POA: Diagnosis not present

## 2015-03-02 DIAGNOSIS — M79605 Pain in left leg: Secondary | ICD-10-CM

## 2015-03-02 MED ORDER — TRAMADOL HCL 50 MG PO TABS: 50.0000 mg | ORAL_TABLET | Freq: Three times a day (TID) | ORAL | Status: AC | PRN

## 2015-03-02 MED ORDER — CYCLOBENZAPRINE HCL 10 MG PO TABS: 10.0000 mg | ORAL_TABLET | Freq: Three times a day (TID) | ORAL | Status: AC | PRN

## 2015-03-02 NOTE — Assessment & Plan Note (Signed)
Worsening back pain: in low back, upper back and L knee  I suspect sciatica  Plan: Tramadol ordered Flexeril ordered  Continue home exercise   X-ray of knee, go at your convenience.   We will wait on MRI

## 2015-03-02 NOTE — Patient Instructions (Signed)
Christine Brown,  Thank you for coming in today  1. Worsening back pain: in low back, upper back and L knee  I suspect sciatica  Plan: Tramadol ordered Flexeril ordered  Continue home exercise   X-ray of knee, go at your convenience.   We will wait on MRI  F/u in 6 weeks for back pain   Dr. Adrian Blackwater

## 2015-03-02 NOTE — Progress Notes (Signed)
   Subjective:    Patient ID: Christine Brown, female    DOB: Dec 14, 1970, 44 y.o.   MRN: [id number] CC: low back and L knee pain  Spanish interpreter present  HPI 44 yo F presents for f/u visit   1. Low back pain: chronic. Persistent. Low back near gluteal area. Radiates down to L knee at times. Also radiates up. Pain is severe like 9-10/10. Pain is exacerbated by flexion at the hips. Doing home exercises as tolerated. Taking mobic and gabapentin with only mild relief of symptoms. Unable to have PT due to lack of health insurance.   2. L knee pain: L lateral knee pain. Slight swelling. No locking or popping. No injury.   History  Substance Use Topics  . Smoking status: Never Smoker   . Smokeless tobacco: Never Used  . Alcohol Use: No   Review of Systems  Constitutional: Negative for fever and chills.  Respiratory: Negative for shortness of breath.   Cardiovascular: Negative for chest pain.  Gastrointestinal: Negative for abdominal pain and blood in stool.  Skin: Negative for rash.  Psychiatric/Behavioral: Negative for suicidal ideas and dysphoric mood.      Objective:   Physical Exam BP 107/73 mmHg  Pulse 81  Temp(Src) 97.9 F (36.6 C) (Oral)  Resp 16  Wt 149 lb (67.586 kg)  SpO2 98%  LMP 01/23/2015  Wt Readings from Last 3 Encounters:  03/02/15 149 lb (67.586 kg)  02/16/15 150 lb (68.04 kg)  01/19/15 152 lb (68.947 kg)  General appearance: alert, cooperative and no distress Lungs: clear to auscultation bilaterally Heart: regular rate and rhythm, S1, S2 normal, no murmur, click, rub or gallop  Back Exam: Back: Normal Curvature, no deformities or CVA tenderness  Paraspinal Tenderness: sacral and L gluteal pain   LE Strength 5/5  LE Sensation: in tact  LE Reflexes 2+ and symmetric  Straight leg raise: negative  FABER: negative Log roll: positive on L      Assessment & Plan:

## 2015-03-02 NOTE — Progress Notes (Signed)
F/U back pain. Stated back pain, stabbing and pressure Pain worsen with movement of the arm

## 2015-03-02 NOTE — Assessment & Plan Note (Signed)
A: L lateral knee pain either referred pain or DJD P: Add tramadol for pain control Knee x-ray ordered

## 2015-03-02 NOTE — Telephone Encounter (Signed)
Results given at [place]

## 2015-03-20 ENCOUNTER — Encounter: Payer: Self-pay | Admitting: Family Medicine

## 2015-03-20 ENCOUNTER — Ambulatory Visit: Payer: Self-pay | Attending: Family Medicine | Admitting: Family Medicine

## 2015-03-20 VITALS — BP 116/72 | HR 70 | Temp 98.0°F | Resp 16 | Wt 151.0 lb

## 2015-03-20 DIAGNOSIS — M25562 Pain in left knee: Secondary | ICD-10-CM

## 2015-03-20 DIAGNOSIS — M2669 Other specified disorders of temporomandibular joint: Secondary | ICD-10-CM

## 2015-03-20 DIAGNOSIS — M79605 Pain in left leg: Principal | ICD-10-CM

## 2015-03-20 DIAGNOSIS — M545 Low back pain: Secondary | ICD-10-CM

## 2015-03-20 DIAGNOSIS — M26629 Arthralgia of temporomandibular joint, unspecified side: Secondary | ICD-10-CM | POA: Insufficient documentation

## 2015-03-20 NOTE — Progress Notes (Signed)
   Subjective:    Patient ID: Christine Brown, female    DOB: Jan 16, 1971, 44 y.o.   MRN: [id number] CC: knee and low back pain  HPI Spanish interpreter present   1. Knee pain: chronic. Tolerable. Medications help. Patient has not had x-rays due to confusion on where to go for knee x-rays.   2. Back pain: L lumbar, radiates down to leg. No falls. Medications. Help. 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   3. L ear pain: pain inside the ear. Mild x 3 days or so. No fever. No drainage. R ear is normal.  Also has L molars that hurt a little bit. Does not have a dental home and requesting a referral.   Social History  Substance Use Topics  . Smoking status: Never Smoker   . Smokeless tobacco: Never Used  . Alcohol Use: No    Review of Systems  Constitutional: Negative for fever and chills.  Eyes: Negative for visual disturbance.  Respiratory: Negative for shortness of breath.   Cardiovascular: Negative for chest pain.  Gastrointestinal: Negative for abdominal pain and blood in stool.  Musculoskeletal: Positive for myalgias, back pain and arthralgias. Negative for joint swelling, gait problem, neck pain and neck stiffness.  Skin: Negative for rash.  Allergic/Immunologic: Negative for immunocompromised state.  Hematological: Negative for adenopathy. Does not bruise/bleed easily.  Psychiatric/Behavioral: Negative for suicidal ideas and dysphoric mood.       Objective:   Physical Exam  Constitutional: She is oriented to person, place, and time. She appears well-developed and well-nourished. No distress.  HENT:  Right Ear: Tympanic membrane, external ear and ear canal normal.  Left Ear: External ear normal.  Ears:  Mouth/Throat:    Pulmonary/Chest: Effort normal.  Musculoskeletal: She exhibits tenderness. She exhibits no edema.       Right knee: She exhibits no swelling and no effusion. No tenderness found.       Left knee: She exhibits no swelling and no  effusion. No tenderness found.       Back:  Neurological: She is alert and oriented to person, place, and time.  Skin: Skin is warm and dry. No rash noted.  Psychiatric: She has a normal mood and affect.        Assessment & Plan:

## 2015-03-20 NOTE — Assessment & Plan Note (Signed)
A; improved but persistent L low back pain with radiculopathy P: Lumbar MRI  Continue current medication regimen

## 2015-03-20 NOTE — Assessment & Plan Note (Signed)
L sided ear pain with cerumen in ear. Pain is most likely originating from jaw.   Ear wax flushed Referral to dental placed

## 2015-03-20 NOTE — Assessment & Plan Note (Signed)
A: persistent L knee pain P: DG knee Patient is now informed on where to go for knee x-ray

## 2015-03-20 NOTE — Progress Notes (Signed)
Interpreter line used Hassan Rowan KN#[id number] Patient states she fell at work a few months ago Patient has been having some discomfort to her left ear Requesting and x ray and MRI and some type of paperwork for  Her job Had a hard time understanding exactly why she is here

## 2015-03-20 NOTE — Patient Instructions (Addendum)
Christine Brown,  Thank you for coming in today Please go to [facility] on the first floor for the x-rays of your knees.   MRI of low back scheduled.   L sided ear pain: Ear wax flushed Referral to dental placed  F/u in 6 weeks with me to review knee x-rays, MRI and flu shot   Dr. Adrian Blackwater

## 2015-03-27 ENCOUNTER — Encounter: Payer: Self-pay | Admitting: Family Medicine

## 2015-03-29 ENCOUNTER — Ambulatory Visit (HOSPITAL_COMMUNITY)
Admission: RE | Admit: 2015-03-29 | Discharge: 2015-03-29 | Disposition: A | Discharge status: Home / Self Care | Payer: Worker's Compensation | Source: Ambulatory Visit | Attending: Family Medicine | Admitting: Family Medicine

## 2015-03-29 DIAGNOSIS — D1809 Hemangioma of other sites: Secondary | ICD-10-CM | POA: Diagnosis not present

## 2015-03-29 DIAGNOSIS — M545 Low back pain, unspecified: Secondary | ICD-10-CM

## 2015-03-29 DIAGNOSIS — M25562 Pain in left knee: Secondary | ICD-10-CM | POA: Insufficient documentation

## 2015-03-29 DIAGNOSIS — M5127 Other intervertebral disc displacement, lumbosacral region: Secondary | ICD-10-CM | POA: Insufficient documentation

## 2015-03-29 DIAGNOSIS — M79605 Pain in left leg: Secondary | ICD-10-CM

## 2015-04-13 ENCOUNTER — Telehealth: Payer: Self-pay | Admitting: *Deleted

## 2015-04-13 NOTE — Telephone Encounter (Signed)
-----   Message from Boykin Nearing, MD sent at 03/29/2015  1:23 PM EDT ----- Normal knee x-ray

## 2015-04-13 NOTE — Telephone Encounter (Signed)
-----   Message from Boykin Nearing, MD sent at 04/03/2015 12:27 PM EDT ----- Shallow L5-S1 disc protrusion contact L S1 could be source of pai Recommend continue current  Treatment plan

## 2015-04-13 NOTE — Telephone Encounter (Signed)
LVM to return call.

## 2015-04-14 NOTE — Telephone Encounter (Signed)
Patient returned nurse call.  Please follow up.

## 2015-04-14 NOTE — Telephone Encounter (Signed)
Pt returned call Date of birth verified by pt Give pt xray and mri results Pt verbalized understanding  Results given in Spanish  Stated feeling sick since last night with vomiting and HA  Advised to go to UC if Sx persist.

## 2015-04-21 ENCOUNTER — Telehealth: Payer: Self-pay | Admitting: Family Medicine

## 2015-04-21 NOTE — Telephone Encounter (Signed)
Patient would like a call form the nurse, No reason specified. Please follow up.

## 2015-04-25 NOTE — Telephone Encounter (Signed)
Patient called asking to speak to nurse, she wants to ask a couple of questions. Please f/u

## 2015-05-01 ENCOUNTER — Ambulatory Visit: Payer: Self-pay | Admitting: Family Medicine

## 2015-05-01 ENCOUNTER — Telehealth: Payer: Self-pay | Admitting: General Practice

## 2015-05-01 NOTE — Telephone Encounter (Signed)
Patient would like a call form the nurse, No reason specified. 3rd Request. Please follow up.

## 2015-05-02 NOTE — Telephone Encounter (Signed)
Patient called and requested to speak to nurse. Please f/u with pt.

## 2015-05-03 ENCOUNTER — Ambulatory Visit: Payer: Worker's Compensation | Attending: Family Medicine | Admitting: Family Medicine

## 2015-05-03 ENCOUNTER — Encounter: Payer: Self-pay | Admitting: Family Medicine

## 2015-05-03 VITALS — BP 131/78 | HR 86 | Temp 98.7°F | Resp 16 | Ht 58.5 in | Wt 151.0 lb

## 2015-05-03 DIAGNOSIS — Z7982 Long term (current) use of aspirin: Secondary | ICD-10-CM | POA: Insufficient documentation

## 2015-05-03 DIAGNOSIS — M545 Low back pain, unspecified: Secondary | ICD-10-CM

## 2015-05-03 DIAGNOSIS — M79605 Pain in left leg: Secondary | ICD-10-CM

## 2015-05-03 DIAGNOSIS — Z79899 Other long term (current) drug therapy: Secondary | ICD-10-CM | POA: Insufficient documentation

## 2015-05-03 DIAGNOSIS — Z0000 Encounter for general adult medical examination without abnormal findings: Secondary | ICD-10-CM

## 2015-05-03 NOTE — Progress Notes (Signed)
Patient ID: Christine Brown, female   DOB: Dec 19, 1970, 44 y.o.   MRN: [id number]   Subjective:  Patient ID: Christine Brown, female    DOB: [birth year]  Age: 44 y.o. MRN: [id number]  CC: Follow-up  HPI Christine Brown presents for   1. L low back pain: pain is L lumbar. Radiates down legs at time. Exacerbated by bending. Pain has been present without worsening or improvement since fall at work on 12/14/2014. No weakness or numbness in legs. No fecal or urinary incontinence.   Outpatient Prescriptions Prior to Visit  Medication Sig Dispense Refill  . Cholecalciferol (VITAMIN D3) 2000 UNITS TABS Take 2,000 Units by mouth daily. 30 tablet 11  . cyclobenzaprine (FLEXERIL) 10 MG tablet Take 1 tablet (10 mg total) by mouth 3 (three) times daily as needed for muscle spasms. 30 tablet 1  . gabapentin (NEURONTIN) 300 MG capsule Take 1-2 capsules (300-600 mg total) by mouth at bedtime. 60 capsule 1  . meloxicam (MOBIC) 15 MG tablet Take 1 tablet (15 mg total) by mouth daily. 30 tablet 1  . traMADol (ULTRAM) 50 MG tablet Take 1 tablet (50 mg total) by mouth every 8 (eight) hours as needed. 30 tablet 2  . Vitamin D, Ergocalciferol, (DRISDOL) 50000 UNITS CAPS capsule Take 1 capsule (50,000 Units total) by mouth every 7 (seven) days. For 8 weeks 8 capsule 0  . aspirin EC 81 MG tablet Take 1 tablet (81 mg total) by mouth daily. 30 tablet 0   No facility-administered medications prior to visit.    ROS Review of Systems  Constitutional: Negative for fever and chills.  Eyes: Negative for visual disturbance.  Respiratory: Negative for shortness of breath.   Cardiovascular: Negative for chest pain.  Gastrointestinal: Negative for abdominal pain and blood in stool.  Musculoskeletal: Positive for back pain. Negative for arthralgias.  Skin: Negative for rash.  Allergic/Immunologic: Negative for immunocompromised state.  Hematological: Negative for adenopathy. Does not bruise/bleed easily.    Psychiatric/Behavioral: Negative for suicidal ideas and dysphoric mood.    Objective:  BP 131/78 mmHg  Pulse 86  Temp(Src) 98.7 F (37.1 C) (Oral)  Resp 16  Ht 4' 10.5" (1.486 m)  Wt 151 lb (68.493 kg)  BMI 31.02 kg/m2  SpO2 98%  LMP 04/14/2015  BP/Weight 05/03/2015 03/20/2015 01/30/3150  Systolic BP 761 607 371  Diastolic BP 78 72 73  Wt. (Lbs) 151 151 149  BMI 31.02 31.02 30.61   Physical Exam  Constitutional: She is oriented to person, place, and time. She appears well-developed and well-nourished. No distress.  HENT:  Head: Normocephalic and atraumatic.  Pulmonary/Chest: Effort normal.  Musculoskeletal: She exhibits no edema.       Lumbar back: She exhibits tenderness. She exhibits normal range of motion, no bony tenderness and no swelling.       Back:  Neurological: She is alert and oriented to person, place, and time.  Skin: Skin is warm and dry. No rash noted.  Psychiatric: She has a normal mood and affect.     Assessment & Plan:   Problem List Items Addressed This Visit    Low back pain radiating to left leg - Primary (Chronic)   Relevant Orders   Ambulatory referral to Physical Therapy   Ambulatory referral to Neurosurgery    Other Visit Diagnoses    Healthcare maintenance        Relevant Orders    Flu Vaccine QUAD 36+ mos IM (Completed)       No orders of  the defined types were placed in this encounter.    Follow-up: No Follow-up on file.   Boykin Nearing MD

## 2015-05-03 NOTE — Patient Instructions (Addendum)
Christine Brown,  Thank you for coming in today  Persistent left low back pain. MRI is abnormal. See report.   Referral to physical therapy and neurosurgery today Continue current medication regimen See below for home exercises  F/u in 6 week for low back pain   Dr. Adrian Blackwater   Citica con rehabilitacin (Sciatica with Rehab) El nervio citico va desde la regin inferior de la espalda hacia la pierna y es el responsable de la sensibilidad y el control de los msculos de la parte de atrs (posterior) del muslo, pierna y pie. La citica es una enfermedad caracterizada por una inflamacin en este nervio.  SNTOMAS  Signos de dao al nervio, incluso adormecimiento o debilidad en el lado posterior de las extremidades bajas.  Dolor en la parte posterior del muslo que podra bajar hacia la pierna.  Dolor que [place] al estar sentado durante largos perodos de [place].  Algunas veces, sensibilidad en las nalgas. CAUSAS La causa de la citica es la inflamacin de los nervios citicos. La inflamacin se debe a que algo irrita el nervio. Entre las causas de la irritacin se encuentran:  Estar sentado durante largos perodos.  Traumatismos directos al nervio.  Artritis de Tax adviser.  Hernia o ruptura de disco.  Deslizamiento de Roselee Nova (espondilolistesis).  Presin de los tejidos blandos, como msculos o el tejidos tipo ligamento (fascia). LOS RIESGOS AUMENTAN CON:  Deportes en los que se presiona la columna (ftbol americano o levantamiento de pesas).  Poca fuerza y flexibilidad.  No hacer un precalentamiento adecuado.  Historia familiar de dolor de cintura o trastornos en discos.  Lesiones o cirugas previas en la espalda.  Mecnica incorrecta del cuerpo, en especial al levantar, o mala postura. PREVENCIN  Precalentamiento adecuado y elongacin antes de la [place].  Mantener la forma fsica:  Kerry Hough, flexibilidad y resistencia muscular.  Capacidad  cardiovascular.  Conozca y use tcnicas adecuadas, especialmente en posturas y levantamiento. Cuando sea posible, tener un entrenador que corrija la Programmer, systems.  Evite las actividades que tensionen constantemente la columna. PRONSTICO Si se trata adecuadamente, generalmente es curable dentro de las 6 semanas. En ocasiones requiere someterse a [place].  [place] que incluye dolor, adormecimiento, hormigueo o debilidad.  Dolor crnico en la espalda.  Riesgos de la ciruga: infecciones, hemorragias, dao en los nervios, o daos a los tejidos circundantes. TRATAMIENTO El tratamiento inicial incluye interrumpir las actividades que agravan los sntomas. Se incluye el uso de medicamentos y la aplicacin de hielo para reducir Conservation officer, historic buildings y la inflamacin. Los ejercicios de elongacin y fortalecimiento pueden ayudar a reducir Conservation officer, historic buildings con la [place]. Los ejercicios pueden Press photographer o con un terapeuta. Un terapeuta podr recomendarle otros tratamientos, como estimulacin nerviosa electrnica transcutnea (TENS) o ultrasonido. En algunos casos se indica una inyeccin de corticoides para reducir la inflamacin del nervio citico. Si los sntomas persisten por ms de 6 meses de tratamiento no quirrgico (conservador), se Biochemist, clinical. MEDICAMENTOS   Si necesita analgsicos, se recomiendan los antiinflamatorios no esteroides, como aspirina e ibuprofeno y otros calmantes menores, como acetaminofeno.  No tome medicamentos para el dolor dentro de los 7 das previos a la [place].  Los analgsicos prescriptos se indicarn si el mdico lo considera necesario. Utilcelos como se le indique y slo cuando lo necesite.  Los ungentos pueden ser beneficiosos.  En algunos casos se indica una inyeccin de corticosteroides. Estas inyecciones deben reservarse para los casos graves, porque slo se pueden  administrar una determinada cantidad de  veces. CALOR Y FRO   El tratamiento con fro [employer] y reduce la inflamacin. El fro debe aplicarse durante 10 a 15 minutos cada 2  3 horas para reducir la inflamacin y Conservation officer, historic buildings e inmediatamente despus de cualquier actividad que agrava los sntomas. Utilice bolsas de hielo o masajee la zona con un trozo de hielo (masaje de hielo).  El calor puede usarse antes de Neurosurgeon y [place] fortalecimiento indicadas por el profesional, le fisioterapeuta o Industrial/product designer. Utilice una bolsa trmica o sumerja la lesin en agua caliente. SOLICITE ATENCIN MDICA SI:  El tratamiento parece no ofrecer beneficios, o el trastorno [place].  Los medicamentos producen efectos secundarios. [place] personas con dolor de citica encuentran que sus sntomas empeoran con el delantero excesiva flexin (flexin) o arco en la espalda baja (extensin). Los ejercicios que le ayudarn a Investment banker, operational sus sntomas se Furniture conservator/restorer. El mdico, fisioterapeuta o Paediatric nurse a Teacher, adult education qu ejercicios sern de ayuda para resolver su dolor de espalda. No realice ningn ejercicio sin consultarlo antes con el profesional. Discontinue los ejercicios que empeoran sus sntomas, hasta que hable con el mdico. Si siente dolor, entumecimiento u hormigueo que [employer] glteos, piernas o pies, el objetivo de esta terapia es que estos sntomas se acerquen a la espalda y Occupational hygienist. En ocasiones, los sntomas de la pierna mejorarn, Psychologist, sport and exercise en la espalda puede empeorar; esto es un indicio tpico del progreso en la rehabilitacin. Asegrese de que estar atento a cualquier cambio en sus sntomas y las actividades que ha [employer] 24 horas antes del cambio. Compartir esta informacin con su mdico le permitir un mejor tratamiento para tratar su enfermedad. Estos ejercicios le ayudarn en  la recuperacin de la lesin. Los sntomas podrn aliviarse con o sin asistencia adicional de su mdico, fisioterapeuta o Administrator, sports. Al completar estos ejercicios, recuerde:   Restaurar la flexibilidad del tejido ayuda a que las articulaciones recuperen el movimiento normal. Esto permite que el movimiento y la actividad sea ms saludables y menos dolorosos.  Para que sea efectiva, cada elongacin debe realizarse durante al menos 30 segundos.  La elongacin nunca debe ser dolorosa. Deber sentir slo un alargamiento o distensin suave del tejido que estira. EJERCICIOS DE AMPLITUD DE MOVIMIENTOS Y ELONGACIN: ELONGACION Flexin - una rodilla al pecho  Recustese en una cama dura o sobre el piso, con ambas piernas extendidas al frente.  Manteniendo una pierna en contacto con el piso, lleve la rodilla opuesta al pecho. Mantenga la pierna en esa posicin, sostenindola por la zona posterior del muslo o por la rodilla.  Presione hasta sentir un suave estiramiento en la cintura. Mantenga esta posicin durante __________ segundos.  Libere la pierna lentamente y repita el ejercicio con el lado opuesto. Reptalo __________ veces. Realice este estiramiento __________ Vicenta Aly por da.  ELONGACIN Flexin, dos rodillas al pecho   Recustese en una cama dura o sobre el piso, con ambas piernas extendidas al frente.  Manteniendo una pierna en contacto con el piso, lleve la rodilla opuesta al pecho.  Tense los msculos del estmago para apoyar la espalda y levante la otra rodilla [place]. Mantenga las piernas en su lugar y tmese por detrs [place].  Con ambas rodillas en el pecho, tire hasta que sienta un estiramiento en la parte trasera  de la espalda. Mantenga esta posicin durante __________ segundos.  Tense los msculos del estmago y baje las piernas de a una por vez. Reptalo __________ veces. Realice este estiramiento __________ Vicenta Aly por da.  ELONGACIN Rotacin de  la zona baja del tronco.  Recustese sobre una cama firme o sobre el suelo. [place], doble las rodillas de modo que ambas apunten hacia el techo y los pies queden bien apoyados en el piso.  Extienda los brazos a Teaching laboratory technician. Esto estabilizar la zona superior del cuerpo, manteniendo los hombros en contacto con el piso.  Con cuidado y lentamente deje caer ambas rodillas juntas hacia un lado, hasta que sienta un suave estiramiento en la espalda baja. Mantenga esta posicin durante __________ segundos.  Tense los msculos del estmago para apoyar la espalda y lleve las rodillas a la posicin inicial. Repita el ejercicio hacia el otro lado. Reptalo __________ veces. Realice este ejercicio __________ veces por da. EJERCICIOS DE AMPLITUD DE MOVIMIENTOS Y FLEXIBILIDAD: ELONGACIN Extensin posicin prona sobre los codos  Acustese sobre el estmago sobre el piso, una cama ser muy blanda. Coloque las palmas a una distancia igual al ancho de los hombres y a la altura de la cabeza.  Coloque los codos bajo los hombros. Si siente dolor, colquese almohadas debajo del pecho.  Deje que su cuerpo se relaje, de modo que las caderas queden ms abajo y tengan ms contacto con el piso.  Mantenga esta posicin durante __________ segundos.  Vuelva lentamente a la posicin plana sobre el piso. Reptalo __________ veces. Realice este estiramiento __________ Vicenta Aly por da.  [place] de brazos en posicin prona  Acustese sobre el [employer] piso, una cama ser Eros. Coloque las palmas a una distancia igual al ancho de los hombres y a la altura de la cabeza.  Mantenga la espalda tan relajada como pueda, enderece lentamente los codos mientras mantiene las caderas contra el suelo. Puede modificar la posicin de las manos para estar ms cmodo. A medida que gana movimiento, sus manos quedarn ms por debajo de los hombros.  Mantenga cada  posicin durante __________ segundos.  Vuelva lentamente a la posicin plana sobre el piso. Reptalo __________ veces. Realice este estiramiento __________ Vicenta Aly por da.  EJERCICIOS DE FORTALECIMIENTO - Citica Estos ejercicios le ayudarn en la recuperacin de la lesin. Estos ejercicios deben hacerse cerca de su "punto dulce". Este es el arco neutro, de la parte baja de la espalda, en algn lugar entre la posicin completamente redondeada y arqueada plenamente, que es la posicin menos dolorosa. Cuando se realiza en [employer] de seguridad del movimiento, estos ejercicios se pueden Risk manager para las personas que tienen una lesin basada en flexin o extensin. Con estos ejercicios, los sntomas podrn desaparecer con o sin mayor intervencin del profesional, el fisioterapeuta o Industrial/product designer. Al completar estos ejercicios, recuerde:   Los msculos pueden ganar tanto la resistencia como la fuerza que necesita para sus actividades diarias a travs de ejercicios controlados.  Realice los ejercicios como se lo indic el mdico, el fisioterapeuta o Industrial/product designer. [place] slo con los ejercicios de resistencia y haga las repeticiones que su mdico le indique.  Podr experimentar dolor o cansancio muscular, pero el dolor o molestia que trata de eliminar a travs de los ejercicios nunca debe empeorar. Si el dolor empeora, detngase y asegrese de que est siguiendo las directivas correctamente. Si an siente dolor luego de Optometrist lo ajustes necesarios, deber  discontinuar el ejercicio hasta que pueda conversar con el profesional sobre el problema. FORTALECIMIENTO - Abdominales profundos - Inclinacin plvica  Recustese sobre una cama firme o sobre el suelo. [place], doble las rodillas de modo que ambas apunten hacia el techo y los pies queden bien apoyados en el piso.  Tensione la zona baja de los msculos abdominales para presionar la Materials engineer. Este movimiento har  rotar su pelvis de modo que el cccix quede hacia arriba y no apuntando a los pies o hacia el piso.  Con una tensin suave y respiracin pareja, mantenga esta posicin durante __________ segundos. Reptalo __________ veces. Realice este estiramiento __________ Vicenta Aly por da.  FORTALECIMIENTO - Abdominales encogimiento abdominal  Recustese sobre una cama firme o sobre el suelo. [place], doble las rodillas de modo que ambas apunten hacia el techo y los pies queden bien apoyados en el piso. [place].  Apunte suavemente con la barbilla hacia abajo, sin doblar el cuello.  Tensione los abdominales y eleve lentamente el tronco la altura suficiente para despegar los omplatos. Si se eleva ms, pondr tensin excesiva en la cintura y esto no fortalecer ms los abdominales.  Controle la vuelta a la posicin inicial. Reptalo __________ veces. Realice este estiramiento __________ Vicenta Aly por da.  FORTALECIMIENTO - En cuadrpedo, elevacin de miembro superior e inferior opuestos  [employer] y las rodillas en una superficie firme. Las manos deben quedar a la altura de los hombros y las rodillas [place]. Puede colocar algo debajo las rodillas para estar ms cmodo.  Encuentre la posicin neutral de la columna vertebral y Heritage manager los msculos abdominales de modo que pueda mantener esta posicin. Los hombros y las caderas deben formar un rectngulo paralelo con el suelo y recto.  Manteniendo el tronco firme, eleve la mano derecha a la altura del hombro y luego eleve la pierna izquierda a la altura de la cadera. Asegrese de no contener la respiracin. Mantenga cada posicin durante __________ segundos.  Con los msculos abdominales en tensin y la espalda firme, vuelva lentamente a la posicin inicial. Repita con el otro brazo y la otra pierna. Reptalo __________ veces. Realice este estiramiento __________ Vicenta Aly por da.  FUERZA  Abdominales y cudriceps - Lexicographer las piernas rectas  Recustese en una cama dura o sobre el piso, con ambas piernas extendidas al frente.  Deje una pierna en contacto con el suelo y doble la otra rodilla de manera que el pie quede contra el suelo.  Encuentre la posicin neutral de la columna vertebral y Heritage manager los msculos abdominales de modo que pueda mantener esta posicin.  Levante lentamente la pierna del suelo una 6 pulgadas y cuente [place] 62, asegrese de no contener la respiracin.  Mantega la columna en posicin neutral, y baje lentamente la pierna hasta el suelo. Repita el ejercicio con cada pierna __________ veces. Realice este estiramiento __________ Vicenta Aly por da. CONSIDERACIONES ACERCA DE LA POSTURA Y LA MECNICA DEL CUERPO Citica Si mantiene una postura correcta cuando se encuentre de pie, sentado o realizando sus actividades, reducir el [employer] tejidos del cuerpo, y Advertising account executive a los tejidos lesionados la posibilidad de curarse y Engineering geologist las experiencias dolorosas. A continuacin se indican pautas generales para mejorar la postura- Su mdico o fisioterapeuta le dar instrucciones especficas segn sus necesidades. Al leer estas pautas recuerde:  Los ejercicios indicados por su mdico lo ayudarn a Scientist, forensic  la flexibilidad y la fuerza para Theatre manager las posturas correctas.  Una postura correcta le proporciona a sus articulaciones el medio ptimo para funcionar bien. Las articulaciones se desgastan menos cuando estn sostenidas adecuadamente por una columna vertebral en buena postura. Esto significa que su cuerpo estar ms sano y Network engineer.  La correcta postura debe practicarse en todas las actividades, especialmente al estar sentado o de pie durante [place]. Tambin es importante al realizar actividades repetitivas de bajo estrs (tipeo) o una actividad nica y pesada. POSICIONES DE Cathe Mons Tenga en cuenta cules son las posturas que ms  dolor le causan al elegir una posicin de descanso. Si siente dolor con las actividades en que deba realizar una flexin (sentarse, inclinarse, detenerse, ponerse en cuclillas), elija una posicin que le permita descansar en una postura menos flexionada. Evite curvarse en posicin fetal cuando se encuentre de lado. Si el dolor empeora con las actividades basadas en la extensin (estar de pie durante un tiempo prolongado, trabajar con las manos por arriba de la cabeza) evite descansar en Ardelia Mems posicin extendida durante mucho tiempo, como dormir sobre el [place]. La [employer] de las Artist cmodo el descanso sobre la columna vertebral en una posicin neutral, ni muy redondeada ni [place]. Recustese sobre su lado en una cama que no est hundida con una almohada entre las rodillas o sobre la espalda con una almohada bajo las rodillas, y sentir [place]. Tenga en cuenta que cualquier posicin en [employer], no importa si es una postura [place], puede provocarle rigidez. POSTURAS CORRECTAS PARA SENTARSE Con el fin de minimizar el estrs y Health and safety inspector en su columna, deber sentarse con la postura correcta. Esto le ayudar a que el cuerpo est ms sano. Recuperar una buena postura es un proceso gradual. Muchas personas pueden trabajar ms cmodas mediante el uso de diferentes soportes hasta que tengan la flexibilidad y la fuerza para mantener esta postura por su cuenta. Al sentarse con la [employer], los odos deben estar sobre los hombros y los hombros [place]. Debe utilizar el respaldo de la silla para apoyar la espalda. La espalda estar en una posicin neutral, ligeramente arqueada. Puede colocar una pequea almohada o toalla doblada en la base de la espalda baja para apoyarse.  Si trabaja en un escritorio, cree un ambiente que le proporciones un buen soporte y [place]. Sin soporte extra, los msculos se fatigan y causan tensin excesiva en las articulaciones y  otros tejidos. Tenga en cuenta estas recomendaciones: SILLA:   La silla debe poder deslizarse por debajo del escritorio cuando su espalda tome contacto con el respaldo. Esto le permitir trabajar ms cerca.  La altura de la silla debe permitirle que los ojos tengan el nivel de la parte superior del monitor y las manos estn ms abajo que los codos. POSICIN DEL CUERPO  Los pies deben tener contacto con el piso. Si no es posible, use un posapies.  Mantenga las [employer] hombros. Esto reducir el estrs en el cuello y en la cintura. POSTURAS INCORRECTAS PARA SENTARSE  Si se siente cansado e incapaz de asumir una postura sentada sana, no se encorve ni se hunda. Esto pone una tensin excesiva en los tejidos de su espalda, y causa ms dao y Social research officer, government.[place] opciones ms saludables se incluyen:  El uso de ms apoyo, como una almohada lumbar.  Cambio de tareas, a algo que demande una posicin vertical o caminar.  Tomar una breve caminata.  Recostarse y Production assistant, radio  en una posicin neutral. DE PIE DURANTE UN TIEMPO PROLONGADO E INCLINADO LIGERAMENTE HACIA ADELANTE Cuando deba realizar una tarea que requiera inclinacin hacia adelante estando de pie en el mismo sitio durante mucho tiempo, coloque un pie en un objeto de 2 a 4 pulgadas de alto, para [employer]. Cuando ambos pies estn en el piso, la zona inferior de la espalda tiene a perder su ligera curvatura hacia adentro. Si esta curva se aplana (o se pronuncia demasiado) la espalda y las articulaciones experimentarn demasiado estrs, se fatigarn ms rpidamente y Therapist, sports.  POSTURAS CORRECTAS PARA ESTAR DE PIE Una postura adecuada de pie realizarse en todas las actividades diarias, incluso si slo toman un momento, como al [employer]. Como en la postura de sentado, los odos deben estar sobre los hombros y los hombros [place]. Deber mantener una ligera tensin en sus msculos abdominales para  asegurar la columna vertebral. El cccix debe apuntar hacia el suelo, no detrs de su cuerpo, por que resulta en una curvatura de la espalda sobre-extendida.  [place] posturas incorrectas para estar de pie incluyen tener la cabeza hacia delante, las rodillas bloqueadas o una excesiva curvatura de la espalda. CAMINAR [place] en [place] erguida. [place], hombros y caderas deben estar alineados. ACTIVIDAD PROLONGADA EN UNA POSICIN FLEXIONADA Al completar una tarea que requiere que se doble la cintura hacia adelante o inclinarse sobre una superficie baja, trate de encontrar una manera de estabilizar 3 de cada 4 de sus miembros. Puede colocar una mano o el codo en el [place], o descansar una rodilla en la superficie en la que est apoyado. Esto le proporcionar ms estabilidad para que sus msculos no se cansen tan rpidamente. El [employer] rodillas [place], o ligeramente dobladas, tambin reducir el estrs en la espalda baja. TCNICAS CORRECTAS PARA LEVANTAR OBJETOS SI:   Asumir una postura amplia. Esto le proporcionar ms estabilidad y la oportunidad de acercarse lo ms posible al objeto que se est levantando.  Tense los abdominales para asegurar la columna vertebral; luego flexione rodillas y caderas. Manteniendo la espalda en una posicin neutral, haga el esfuerzo con los msculos de la pierna. Levntese con las piernas, manteniendo la espalda derecha.  Pruebe el peso de los objetos desconocidos antes de tratar de Special educational needs teacher.  Trate de [employer] codos hacia abajo y a los lados, con el fin de obtener la fuerza de los hombros al llevar un objeto.  Siempre pida ayuda a otra persona cuando deba levantar objetos pesados o incmodos TCNICAS INCORRECTAS PARA LEVANTAR OBJETOS NO:   [place] rodillas al levantar, aunque sea un objeto pequeo.  Se doble ni gire. Gire sobre los pies ni los mueva cuando necesite cambiar de direccin.  Tome conciencia  que no puede levantar con seguridad ni un clip de papel, sin Chiropodist. Document Released: 07/10/2009 Document Revised: 10/14/2011 [facility] Patient Information 2015 [place]. This information is not intended to replace advice given to you by your health care provider. Make sure you discuss any questions you have with your health care provider.

## 2015-05-03 NOTE — Progress Notes (Signed)
F/U hip pain  MRI results

## 2016-08-07 IMAGING — CR DG LUMBAR SPINE COMPLETE 4+V
5 series · 5 of 5 positions shown · non-contrast
Comparison: None.

CLINICAL DATA: Fall.  Back pain

EXAM:
LUMBAR SPINE - COMPLETE 4+ VIEW

[t lumbar spine lat]
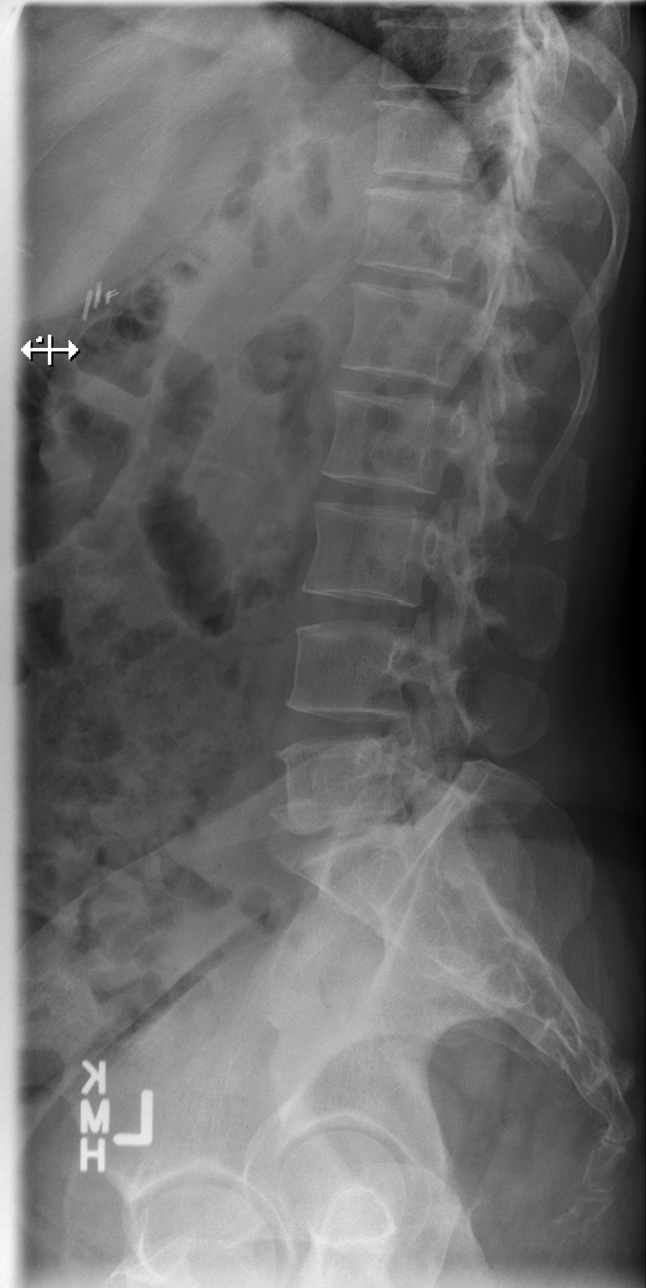

[t lumbar l-5 s-1 spot]
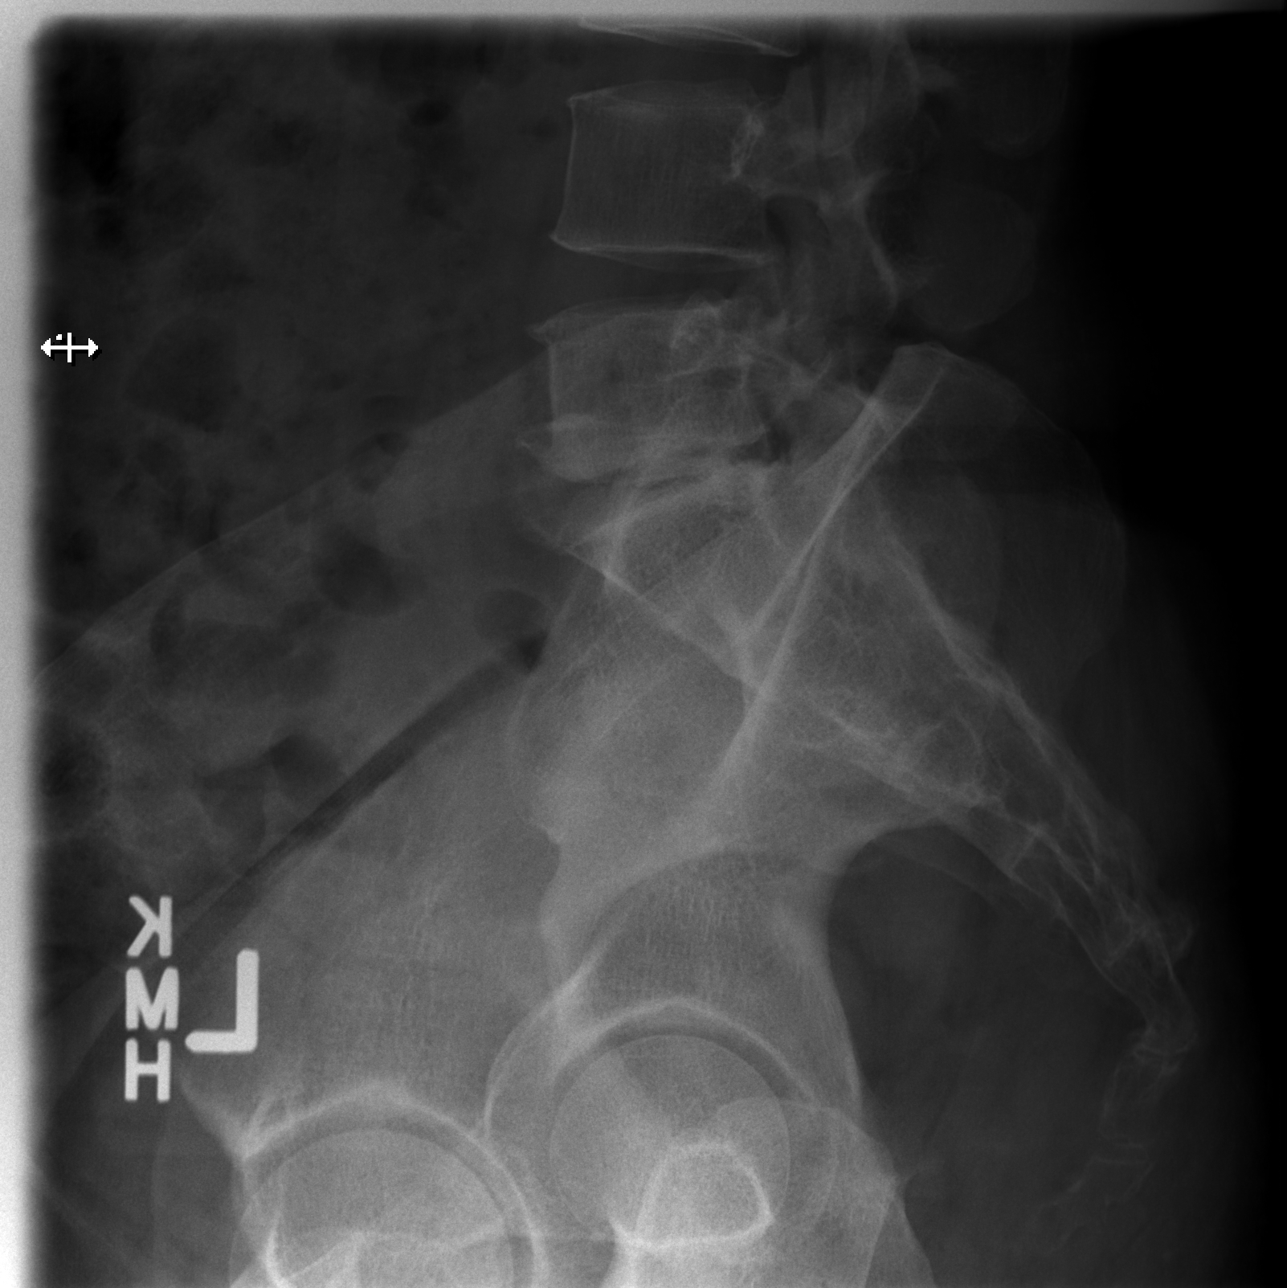

[t lumbar spine ap]
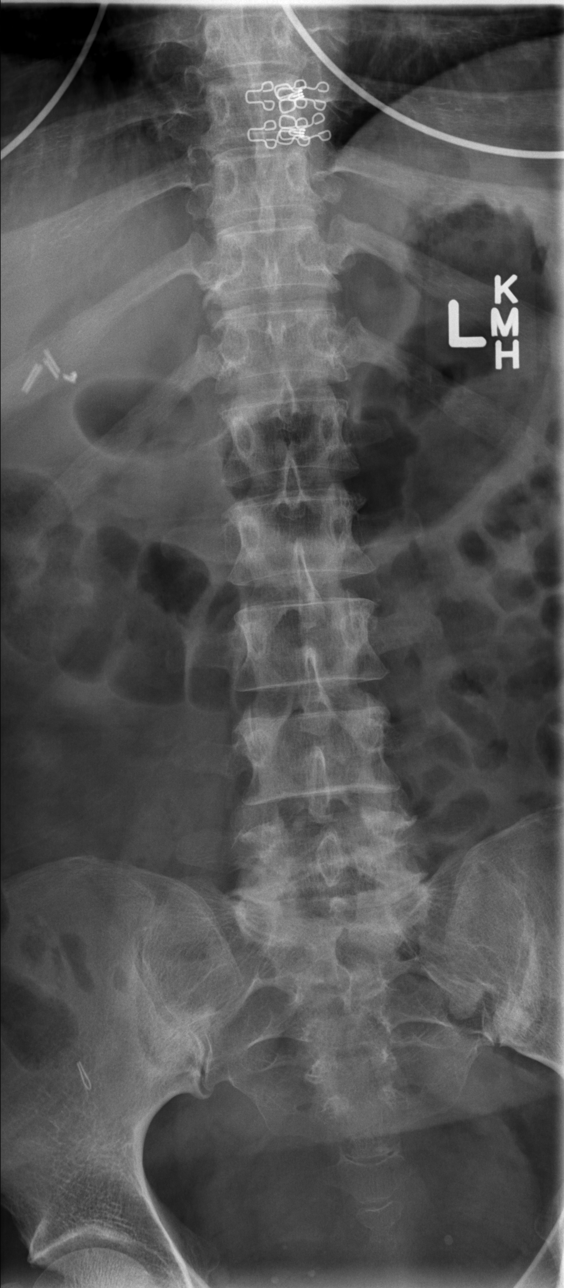

[t lumbar spine obl (1 of 2)]
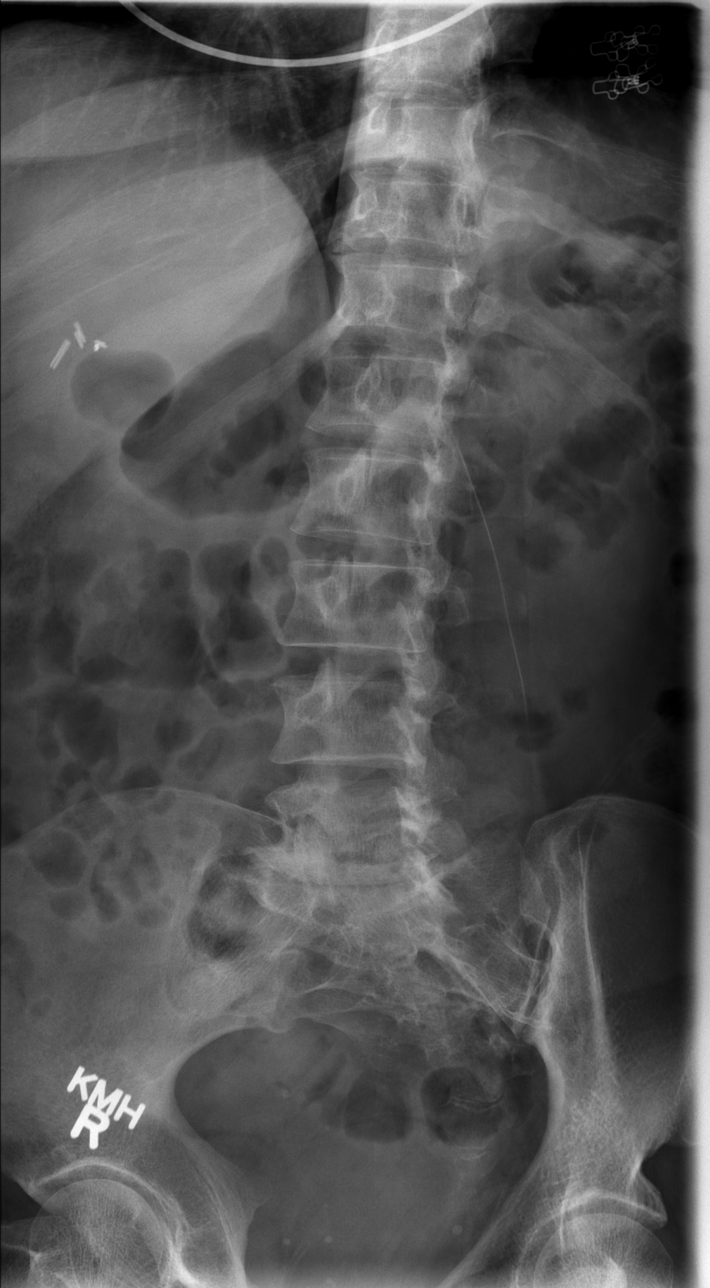

[t lumbar spine obl (2 of 2)]
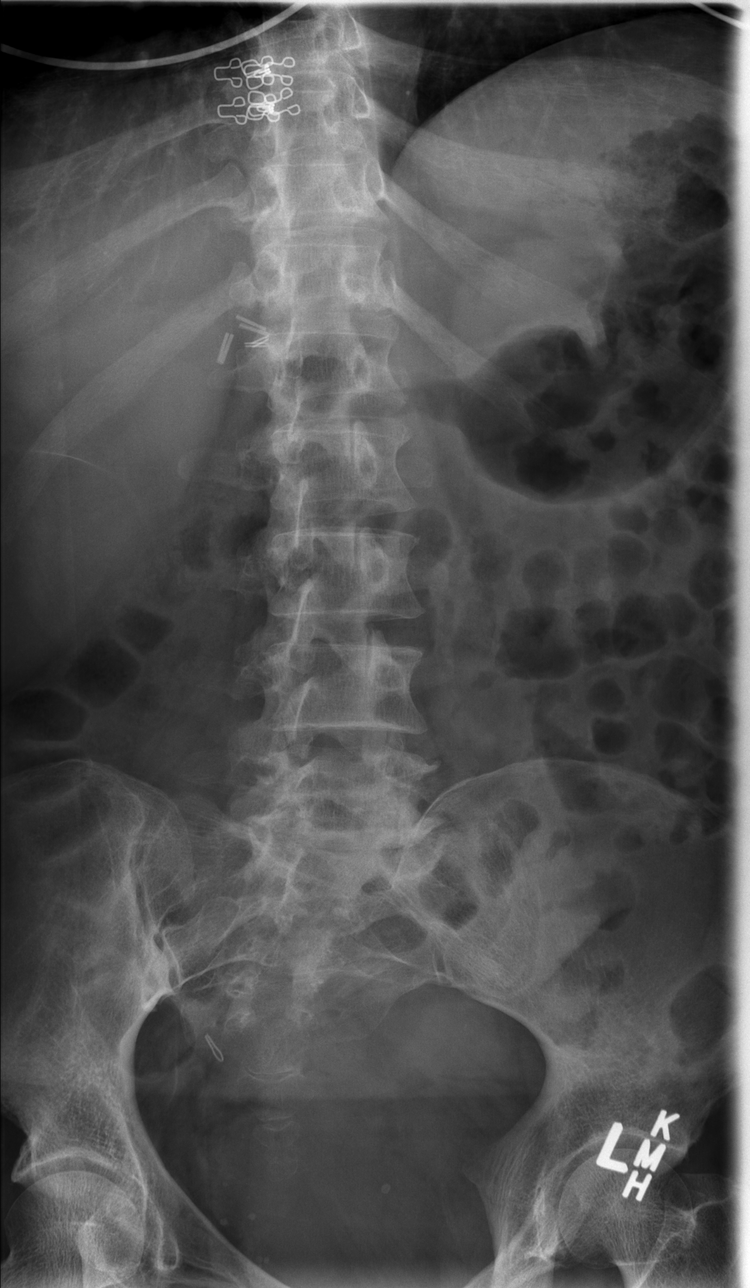

[5 of 5 positions shown; findings below may reference images not displayed]

FINDINGS: Negative for fracture or pars defect.  Normal alignment

Moderate disc degeneration at L5-S1 with disc space narrowing and
spurring. Remaining disc spaces have normal height.
IMPRESSION: Disc degeneration and spondylosis L5-S1.  Negative for fracture.
